# Patient Record
Sex: Female | Born: 1935 | Race: White | Hispanic: No | Smoking: Never smoker
Health system: Southern US, Community
[De-identification: ages and names within clinical notes are randomized; demographics above are authoritative.]

## PROBLEM LIST (undated history)

## (undated) DIAGNOSIS — R42 Dizziness and giddiness: Secondary | ICD-10-CM

## (undated) DIAGNOSIS — I10 Essential (primary) hypertension: Secondary | ICD-10-CM

---

## 2016-04-09 HISTORY — PX: FRACTURE SURGERY: SHX138

## 2018-02-11 ENCOUNTER — Observation Stay
Admission: EM | Admit: 2018-02-11 | Discharge: 2018-02-13 | Disposition: A | Discharge status: Home / Self Care | Payer: Medicare HMO | Attending: Internal Medicine | Admitting: Internal Medicine

## 2018-02-11 DIAGNOSIS — N179 Acute kidney failure, unspecified: Principal | ICD-10-CM | POA: Diagnosis present

## 2018-02-11 DIAGNOSIS — R197 Diarrhea, unspecified: Secondary | ICD-10-CM | POA: Insufficient documentation

## 2018-02-11 DIAGNOSIS — B9620 Unspecified Escherichia coli [E. coli] as the cause of diseases classified elsewhere: Secondary | ICD-10-CM | POA: Insufficient documentation

## 2018-02-11 DIAGNOSIS — I959 Hypotension, unspecified: Secondary | ICD-10-CM | POA: Diagnosis not present

## 2018-02-11 DIAGNOSIS — Z79899 Other long term (current) drug therapy: Secondary | ICD-10-CM | POA: Insufficient documentation

## 2018-02-11 DIAGNOSIS — N3090 Cystitis, unspecified without hematuria: Secondary | ICD-10-CM

## 2018-02-11 DIAGNOSIS — N390 Urinary tract infection, site not specified: Secondary | ICD-10-CM | POA: Insufficient documentation

## 2018-02-11 DIAGNOSIS — E860 Dehydration: Secondary | ICD-10-CM | POA: Diagnosis not present

## 2018-02-11 DIAGNOSIS — R55 Syncope and collapse: Secondary | ICD-10-CM

## 2018-02-11 DIAGNOSIS — I10 Essential (primary) hypertension: Secondary | ICD-10-CM | POA: Diagnosis not present

## 2018-02-11 DIAGNOSIS — F329 Major depressive disorder, single episode, unspecified: Secondary | ICD-10-CM | POA: Insufficient documentation

## 2018-02-11 DIAGNOSIS — R42 Dizziness and giddiness: Secondary | ICD-10-CM | POA: Insufficient documentation

## 2018-02-11 HISTORY — DX: Essential (primary) hypertension: I10

## 2018-02-11 HISTORY — DX: Dizziness and giddiness: R42

## 2018-02-11 LAB — COMPREHENSIVE METABOLIC PANEL
ALK PHOS: 92 U/L (ref 38–126)
ALT: 11 U/L (ref 0–44)
ANION GAP: 9 (ref 5–15)
AST: 27 U/L (ref 15–41)
Albumin: 3.6 g/dL (ref 3.5–5.0)
BILIRUBIN TOTAL: 1.1 mg/dL (ref 0.3–1.2)
BUN: 28 mg/dL — ABNORMAL HIGH (ref 8–23)
CALCIUM: 9.4 mg/dL (ref 8.9–10.3)
CO2: 23 mmol/L (ref 22–32)
CREATININE: 1.84 mg/dL — AB (ref 0.44–1.00)
Chloride: 110 mmol/L (ref 98–111)
GFR calc non Af Amer: 23 mL/min — ABNORMAL LOW (ref >60)
GFR, EST AFRICAN AMERICAN: 26 mL/min — AB (ref >60)
GLUCOSE: 168 mg/dL — AB (ref 70–99)
Potassium: 4.8 mmol/L (ref 3.5–5.1)
SODIUM: 142 mmol/L (ref 135–145)
TOTAL PROTEIN: 7.3 g/dL (ref 6.5–8.1)

## 2018-02-11 LAB — URINALYSIS, ROUTINE W REFLEX MICROSCOPIC
BILIRUBIN URINE: NEGATIVE
Bacteria, UA: NONE SEEN
Glucose, UA: NEGATIVE mg/dL
HGB URINE DIPSTICK: NEGATIVE
KETONES UR: NEGATIVE mg/dL
Nitrite: NEGATIVE
Protein, ur: 100 mg/dL — AB
Specific Gravity, Urine: 1.015 (ref 1.005–1.030)
Squamous Epithelial / LPF: >50 — ABNORMAL HIGH (ref 0–5)
pH: 7 (ref 5.0–8.0)

## 2018-02-11 LAB — C DIFFICILE QUICK SCREEN W PCR REFLEX
C Diff antigen: NEGATIVE
C Diff interpretation: NOT DETECTED
C Diff toxin: NEGATIVE

## 2018-02-11 LAB — CBC WITH DIFFERENTIAL/PLATELET
Abs Immature Granulocytes: 0.1 10*3/uL — ABNORMAL HIGH (ref 0.00–0.07)
Basophils Absolute: 0 10*3/uL (ref 0.0–0.1)
Basophils Relative: 0 %
EOS ABS: 0.2 10*3/uL (ref 0.0–0.5)
Eosinophils Relative: 2 %
HEMATOCRIT: 41.4 % (ref 36.0–46.0)
HEMOGLOBIN: 13.7 g/dL (ref 12.0–15.0)
Immature Granulocytes: 1 %
LYMPHS ABS: 1.8 10*3/uL (ref 0.7–4.0)
LYMPHS PCT: 17 %
MCH: 32.5 pg (ref 26.0–34.0)
MCHC: 33.1 g/dL (ref 30.0–36.0)
MCV: 98.1 fL (ref 80.0–100.0)
MONO ABS: 0.7 10*3/uL (ref 0.1–1.0)
Monocytes Relative: 7 %
Neutro Abs: 7.6 10*3/uL (ref 1.7–7.7)
Neutrophils Relative %: 73 %
Platelets: 247 10*3/uL (ref 150–400)
RBC: 4.22 MIL/uL (ref 3.87–5.11)
RDW: 12.8 % (ref 11.5–15.5)
WBC: 10.4 10*3/uL (ref 4.0–10.5)
nRBC: 0.2 % (ref 0.0–0.2)

## 2018-02-11 LAB — GASTROINTESTINAL PANEL BY PCR, STOOL (REPLACES STOOL CULTURE)

## 2018-02-11 LAB — LIPASE, BLOOD: Lipase: 41 U/L (ref 11–51)

## 2018-02-11 LAB — INFLUENZA PANEL BY PCR (TYPE A & B)
INFLAPCR: NEGATIVE
INFLBPCR: NEGATIVE

## 2018-02-11 LAB — TROPONIN I: Troponin I: <0.03 ng/mL (ref <0.03)

## 2018-02-11 MED ORDER — ALBUTEROL SULFATE (2.5 MG/3ML) 0.083% IN NEBU: 2.5000 mg | INHALATION_SOLUTION | RESPIRATORY_TRACT | Status: DC | PRN

## 2018-02-11 MED ORDER — SODIUM CHLORIDE 0.9 % IV SOLN
1.0000 g | Freq: Once | INTRAVENOUS | Status: AC
  Administered 2018-02-11: 1 g via INTRAVENOUS
  Filled 2018-02-11: qty 10

## 2018-02-11 MED ORDER — ACETAMINOPHEN 650 MG RE SUPP: 650.0000 mg | Freq: Four times a day (QID) | RECTAL | Status: DC | PRN

## 2018-02-11 MED ORDER — SENNOSIDES-DOCUSATE SODIUM 8.6-50 MG PO TABS: 1.0000 | ORAL_TABLET | Freq: Every evening | ORAL | Status: DC | PRN

## 2018-02-11 MED ORDER — SODIUM CHLORIDE 0.9 % IV SOLN
INTRAVENOUS | Status: DC
  Administered 2018-02-11: 22:00:00 via INTRAVENOUS

## 2018-02-11 MED ORDER — HEPARIN SODIUM (PORCINE) 5000 UNIT/ML IJ SOLN
5000.0000 [IU] | Freq: Three times a day (TID) | INTRAMUSCULAR | Status: DC
  Administered 2018-02-11 – 2018-02-13 (×5): 5000 [IU] via SUBCUTANEOUS
  Filled 2018-02-11 (×5): qty 1

## 2018-02-11 MED ORDER — ONDANSETRON HCL 4 MG PO TABS: 4.0000 mg | ORAL_TABLET | Freq: Four times a day (QID) | ORAL | Status: DC | PRN

## 2018-02-11 MED ORDER — BISACODYL 5 MG PO TBEC: 5.0000 mg | DELAYED_RELEASE_TABLET | Freq: Every day | ORAL | Status: DC | PRN

## 2018-02-11 MED ORDER — HYDROCODONE-ACETAMINOPHEN 5-325 MG PO TABS: 1.0000 | ORAL_TABLET | ORAL | Status: DC | PRN

## 2018-02-11 MED ORDER — ACETAMINOPHEN 325 MG PO TABS: 650.0000 mg | ORAL_TABLET | Freq: Four times a day (QID) | ORAL | Status: DC | PRN

## 2018-02-11 MED ORDER — SODIUM CHLORIDE 0.9 % IV BOLUS
1000.0000 mL | Freq: Once | INTRAVENOUS | Status: AC
  Administered 2018-02-11: 1000 mL via INTRAVENOUS

## 2018-02-11 MED ORDER — HYDRALAZINE HCL 20 MG/ML IJ SOLN: 10.0000 mg | Freq: Four times a day (QID) | INTRAMUSCULAR | Status: DC | PRN

## 2018-02-11 MED ORDER — ONDANSETRON HCL 4 MG/2ML IJ SOLN: 4.0000 mg | Freq: Four times a day (QID) | INTRAMUSCULAR | Status: DC | PRN

## 2018-02-11 MED ORDER — ESCITALOPRAM OXALATE 10 MG PO TABS
5.0000 mg | ORAL_TABLET | Freq: Every day | ORAL | Status: DC
  Administered 2018-02-11 – 2018-02-13 (×3): 5 mg via ORAL
  Filled 2018-02-11 (×4): qty 0.5

## 2018-02-11 MED ORDER — SODIUM CHLORIDE 0.9 % IV SOLN
1.0000 g | INTRAVENOUS | Status: DC
  Administered 2018-02-12: 1 g via INTRAVENOUS
  Filled 2018-02-11 (×2): qty 10
  Filled 2018-02-11: qty 1

## 2018-02-11 NOTE — Care Management Note (Signed)
Case Management Note  Patient Details  Name: Connie Beck MRN: [id number] Date of Birth: Aug 23, 1923  Subjective/Objective:           Patient is being placed under observation for UTI and AKI.  She was brought to the ED from home for syncope episodes.  The patient lives with her daughter in law.  Her son was married to the daughter in law and he passed away, the daughter in law has remarried but she has assumed care over the patient.  Daughter in law provides all care in the home, bathing, dressing, cooking.  The patient has a walker, wheelchair, bedside commode, and hospital bed.  When the daughter in law goes on vacation or needs some time she has a friend that is an aide that she pays to come in and take care of the patient.  She would agree to home health services if recommended.  Patient is very hard of hearing.            Action/Plan:   Expected Discharge Date:                  Expected Discharge Plan:     In-House Referral:     Discharge planning Services  CM Consult  Post Acute Care Choice:    Choice offered to:     DME Arranged:    DME Agency:     HH Arranged:    HH Agency:     Status of Service:  In process, will continue to follow  If discussed at Long Length of Stay Meetings, dates discussed:    Additional Comments:  Allayne Butcher, RN 02/11/2018, 4:28 PM

## 2018-02-11 NOTE — ED Notes (Signed)
Pt BP" reading 202/84. Jerrie,RN made aware. BP cuff readjusted and BP rechecked. 171/70

## 2018-02-11 NOTE — ED Triage Notes (Signed)
Patient from home via [facility] c/o syncope. Per EMS pt had a syncopal episode witnessed by daughter (assisted to ground without hitting head) initial BP 84/66. EMS also reports pt incontinent of stool upon arrival with dark stools noted and foul smelling urine reported.

## 2018-02-11 NOTE — Consult Note (Signed)
Pharmacy Antibiotic Note  Connie Beck is a 84 y.o. female admitted on 02/11/2018 with UTI.  Pharmacy has been consulted for Ceftriaxone dosing.  Plan: Ceftriaxone 1g q 24 hours  Height: 5\' 3"  (160 cm) Weight: 144 lb (65.3 kg) IBW/kg (Calculated) : 52.4  Temp (24hrs), Avg:97.7 F (36.5 C), Min:97.7 F (36.5 C), Max:97.7 F (36.5 C)  Recent Labs  Lab 02/11/18 1257  WBC 10.4  CREATININE 1.84*    Estimated Creatinine Clearance: 17.7 mL/min (A) (by C-G formula based on SCr of 1.84 mg/dL (H)).    Allergies  Allergen Reactions  . Darvon [Propoxyphene]   . Penicillins     Has patient had a PCN reaction causing immediate rash, facial/tongue/throat swelling, SOB or lightheadedness with hypotension: Unknown Has patient had a PCN reaction causing severe rash involving mucus membranes or skin necrosis: Unknown Has patient had a PCN reaction that required hospitalization: Unknown Has patient had a PCN reaction occurring within the last 10 years: No If all of the above answers are "NO", then may proceed with Cephalosporin use.  . Sulfa Antibiotics     Antimicrobials this admission: Ceftriaxone 11/5 >>    Dose adjustments this admission: N/A  Microbiology results: 11/05 UCx: pending 11/05 C diff quick scan neg 11/05 GI panel - not detected  Thank you for allowing pharmacy to be a part of this patient's care.  Albina Billet, PharmD Clinical Pharmacist 02/11/2018 3:47 PM

## 2018-02-11 NOTE — ED Notes (Signed)
Pt was cleaned up and clean brief applied.

## 2018-02-11 NOTE — Progress Notes (Signed)
Advanced Care Plan.  Purpose of Encounter: CODE STATUS. Parties in Attendance: The patient, her daughter-in-law and grandson, me. Patient's Decisional Capacity: Yes. Medical Story: Connie Beck  is a 84 y.o. female with a known history of hypertension.  She is being admitted for UTI and acute renal failure.  I discussed with the patient and her daughter-in-law about her current condition, prognosis and the CODE STATUS.  The patient want to be resuscitated and intubated if she has cardiopulmonary arrest.  This is confirmed by her daughter-in-law. Plan:  Code Status: Full code. Time spent discussing advance care planning: 17 minutes.

## 2018-02-11 NOTE — Care Management Obs Status (Signed)
MEDICARE OBSERVATION STATUS NOTIFICATION   Patient Details  Name: Connie Beck MRN: [id number] Date of Birth: Oct 18, 1923   Medicare Observation Status Notification Given:  Yes    Allayne Butcher, RN 02/11/2018, 4:28 PM

## 2018-02-11 NOTE — ED Provider Notes (Addendum)
[facility] Emergency Department Provider Note  ____________________________________________  Time seen: Approximately 2:22 PM  I have reviewed the triage vital signs and the nursing notes.   HISTORY  Chief Complaint Loss of Consciousness  Level 5 Caveat: Portions of the History and Physical including HPI and review of systems are unable to be completely obtained due to patient being a poor historian    HPI Connie Beck is a 84 y.o. female with a history of hypertension who takes no medications who was brought to the ED today after syncope at home.  She was caught by her daughter and lowered to the ground without any significant injury.  Initial blood pressure was 84/66 by first responders.  Family report the patient has had foul-smelling urine recently and dark stools.      Past Medical History:  Diagnosis Date  . Dizzy spells   . Hypertension      There are no active problems to display for this patient.    Past Surgical History:  Procedure Laterality Date  . FRACTURE SURGERY  2018   pelvis     Current medications: None  Allergies Darvon [propoxyphene]; Penicillins; and Sulfa antibiotics   Family History  Family history unknown: Yes    Social History Social History   Tobacco Use  . Smoking status: Never Smoker  . Smokeless tobacco: Never Used  Substance Use Topics  . Alcohol use: Never    Frequency: Never  . Drug use: Never    Review of Systems  Constitutional:   No fever or chills.  ENT:   No sore throat. No rhinorrhea. Cardiovascular:   No chest pain or syncope. Respiratory:   No dyspnea or cough. Gastrointestinal:   Negative for abdominal pain, vomiting and diarrhea.  Musculoskeletal:   Negative for focal pain or swelling All other systems reviewed and are negative except as documented above in ROS and HPI.  ____________________________________________   PHYSICAL EXAM:  VITAL SIGNS: ED Triage Vitals  Enc Vitals  Group     BP 02/11/18 1248 137/80     Pulse Rate 02/11/18 1248 88     Resp 02/11/18 1248 20     Temp 02/11/18 1248 97.7 F (36.5 C)     Temp Source 02/11/18 1248 Oral     SpO2 02/11/18 1246 97 %     Weight 02/11/18 1250 144 lb (65.3 kg)     Height 02/11/18 1250 5\' 3"  (1.6 m)     Head Circumference --      Peak Flow --      Pain Score 02/11/18 1250 0     Pain Loc --      Pain Edu? --      Excl. in GC? --     Vital signs reviewed, nursing assessments reviewed.   Constitutional:   Alert and oriented to person and place. Non-toxic appearance. Eyes:   Conjunctivae are normal. EOMI. PERRL. ENT      Head:   Normocephalic and atraumatic.      Nose:   No congestion/rhinnorhea.       Mouth/Throat:   Dry mucous membranes, no pharyngeal erythema. No peritonsillar mass.       Neck:   No meningismus. Full ROM. Hematological/Lymphatic/Immunilogical:   No cervical lymphadenopathy. Cardiovascular:   RRR. Symmetric bilateral radial and DP pulses.  No murmurs. Cap refill less than 2 seconds. Respiratory:   Normal respiratory effort without tachypnea/retractions. Breath sounds are clear and equal bilaterally. No wheezes/rales/rhonchi. Gastrointestinal:   Soft  and nontender. Non distended. There is no CVA tenderness.  No rebound, rigidity, or guarding.  Large liquid stool in her adult diaper.  Hemoccult negative  Musculoskeletal:   Normal range of motion in all extremities. No joint effusions.  No lower extremity tenderness.  No edema. Neurologic:   Normal speech and language.  Motor grossly intact. No acute focal neurologic deficits are appreciated.  Skin:    Skin is warm, dry and intact. No rash noted.  No petechiae, purpura, or bullae.  ____________________________________________    LABS (pertinent positives/negatives) (all labs ordered are listed, but only abnormal results are displayed) Labs Reviewed  COMPREHENSIVE METABOLIC PANEL - Abnormal; Notable for the following components:       Result Value   Glucose, Bld 168 (*)    BUN 28 (*)    Creatinine, Ser 1.84 (*)    GFR calc non Af Amer 23 (*)    GFR calc Af Amer 26 (*)    All other components within normal limits  CBC WITH DIFFERENTIAL/PLATELET - Abnormal; Notable for the following components:   Abs Immature Granulocytes 0.10 (*)    All other components within normal limits  URINALYSIS, ROUTINE W REFLEX MICROSCOPIC - Abnormal; Notable for the following components:   APPearance TURBID (*)    Protein, ur 100 (*)    Leukocytes, UA SMALL (*)    RBC / HPF >50 (*)    WBC, UA >50 (*)    Squamous Epithelial / LPF >50 (*)    All other components within normal limits  C DIFFICILE QUICK SCREEN W PCR REFLEX  GASTROINTESTINAL PANEL BY PCR, STOOL (REPLACES STOOL CULTURE)  URINE CULTURE  LIPASE, BLOOD  TROPONIN I  INFLUENZA PANEL BY PCR (TYPE A & B)   ____________________________________________   EKG  Interpreted by me Sinus rhythm rate of 89, left axis, normal intervals.  Normal QRS ST segments and T waves.  ____________________________________________    RADIOLOGY  No results found.  ____________________________________________   PROCEDURES Procedures  ____________________________________________  DIFFERENTIAL DIAGNOSIS   UTI, dehydration, GI bleed, vasovagal syncope.  Doubt ACS PE dissection AAA pneumothorax pericarditis pneumonia or sepsis.  CLINICAL IMPRESSION / ASSESSMENT AND PLAN / ED COURSE  Pertinent labs & imaging results that were available during my care of the patient were reviewed by me and considered in my medical decision making (see chart for details).    Patient presents with syncope and one isolated episode of borderline low blood pressure at home.  Current vital signs are unremarkable.  Exam is benign.  Given IV fluids for hydration, check labs and urinalysis.  UTI suspected, but patient is not septic on initial assessment.  No abdominal or flank pain complaints.  No history of  kidney stones.  Doubt occult ureterolithiasis   Clinical Course as of Feb 12 1515  Tue Feb 11, 2018  1510 Flu negative.  C. difficile negative.  Ceftriaxone infused.  Engaged in shared decision-making with family regarding disposition.  Recommended hospitalization given the patient's episode of hypotension and fall earlier today and finding of UTI and AKI.  They agree that the patient is not sufficiently independent at home and needs a lot of assistance that they are not equipped to provide during this acute illness.   [PS]    Clinical Course User Index [PS] Sharman Cheek, MD     ____________________________________________   FINAL CLINICAL IMPRESSION(S) / ED DIAGNOSES    Final diagnoses:  AKI (acute kidney injury) (HCC)  Cystitis  Syncope, unspecified syncope  type     ED Discharge Orders    None      Portions of this note were generated with dragon dictation software. Dictation errors may occur despite best attempts at proofreading.    Sharman Cheek, MD 02/11/18 1516    Sharman Cheek, MD 02/11/18 [phone]

## 2018-02-11 NOTE — ED Notes (Signed)
Kassie RN, aware of bed assigned  

## 2018-02-11 NOTE — H&P (Signed)
[facility]   PATIENT NAME: Connie Beck    MR#:  [id number]  DATE OF BIRTH:  1923/11/17  DATE OF ADMISSION:  02/11/2018  PRIMARY CARE PHYSICIAN: [facility]   REQUESTING/REFERRING PHYSICIAN: Dr. Scotty Court  CHIEF COMPLAINT:   Chief Complaint  Patient presents with  . Loss of Consciousness   Dizziness and generalized weakness. HISTORY OF PRESENT ILLNESS:  Connie Beck  is a 84 y.o. female with a known history of hypertension.  The patient was sent to ED due to above chief complaints.  The patient is very hard of hearing and is not a good historian.  According to her daughter-in-law, the patient has chronic incontinence.  She feels dizzy and was caught by her daughter and lowered to the ground without any significant injury.  Her blood pressure was 84/66 by first responders.  She is found due to infection and acute renal failure in the ED. PAST MEDICAL HISTORY:   Past Medical History:  Diagnosis Date  . Dizzy spells   . Hypertension     PAST SURGICAL HISTORY:   Past Surgical History:  Procedure Laterality Date  . FRACTURE SURGERY  2018   pelvis    SOCIAL HISTORY:   Social History   Tobacco Use  . Smoking status: Never Smoker  . Smokeless tobacco: Never Used  Substance Use Topics  . Alcohol use: Never    Frequency: Never    FAMILY HISTORY:   Family History  Family history unknown: Yes  The patient does not know.  DRUG ALLERGIES:   Allergies  Allergen Reactions  . Darvon [Propoxyphene]   . Penicillins     Has patient had a PCN reaction causing immediate rash, facial/tongue/throat swelling, SOB or lightheadedness with hypotension: Unknown Has patient had a PCN reaction causing severe rash involving mucus membranes or skin necrosis: Unknown Has patient had a PCN reaction that required hospitalization: Unknown Has patient had a PCN reaction occurring within the last 10 years: No If all of the  above answers are "NO", then may proceed with Cephalosporin use.  . Sulfa Antibiotics     REVIEW OF SYSTEMS:   Review of Systems  Unable to perform ROS: Medical condition   Very hard of hearing. MEDICATIONS AT HOME:   Prior to Admission medications   Medication Sig Start Date End Date Taking? Authorizing Provider  escitalopram (LEXAPRO) 5 MG tablet Take 5 mg by mouth daily.   Yes [provider]      VITAL SIGNS:  Blood pressure 135/72, pulse 74, temperature 97.7 F (36.5 C), temperature source Oral, resp. rate 20, height 5\' 3"  (1.6 m), weight 65.3 kg, SpO2 95 %.  PHYSICAL EXAMINATION:  Physical Exam  GENERAL:  84 y.o.-year-old patient lying in the bed with no acute distress.  EYES: Pupils equal, round, reactive to light and accommodation. No scleral icterus. Extraocular muscles intact.  HEENT: Head atraumatic, normocephalic. Oropharynx and nasopharynx clear.  Dry oral mucosa. Very hard of hearing. NECK:  Supple, no jugular venous distention. No thyroid enlargement, no tenderness.  LUNGS: Normal breath sounds bilaterally, no wheezing, rales,rhonchi or crepitation. No use of accessory muscles of respiration.  CARDIOVASCULAR: S1, S2 normal. No murmurs, rubs, or gallops.  ABDOMEN: Soft, nontender, nondistended. Bowel sounds present. No organomegaly or mass.  EXTREMITIES: No pedal edema, cyanosis, or clubbing.  NEUROLOGIC: Cranial nerves II through XII are intact. Muscle strength 3-4/5 in all extremities. Sensation intact. Gait not checked.  PSYCHIATRIC: The  patient is alert and oriented x 3.  SKIN: No obvious rash, lesion, or ulcer.   LABORATORY PANEL:   CBC Recent Labs  Lab 02/11/18 1257  WBC 10.4  HGB 13.7  HCT 41.4  PLT 247   ------------------------------------------------------------------------------------------------------------------  Chemistries  Recent Labs  Lab 02/11/18 1257  NA 142  K 4.8  CL 110  CO2 23  GLUCOSE 168*  BUN 28*  CREATININE  1.84*  CALCIUM 9.4  AST 27  ALT 11  ALKPHOS 92  BILITOT 1.1   ------------------------------------------------------------------------------------------------------------------  Cardiac Enzymes Recent Labs  Lab 02/11/18 1257  TROPONINI <0.03   ------------------------------------------------------------------------------------------------------------------  RADIOLOGY:  No results found.    IMPRESSION AND PLAN:   UTI. The patient will be placed for observation. Continue Rocephin, follow-up urine culture.  Acute renal failure due to dehydration.  Start normal saline IV and follow-up BMP.  Hypertension.  Controlled.medication.  All the records are reviewed and case discussed with ED provider. Management plans discussed with the patient, daughter-in-law and grandson and they are in agreement.  CODE STATUS: Full code  TOTAL TIME TAKING CARE OF THIS PATIENT: 36 minutes.    Shaune Pollack M.D on 02/11/2018 at 3:41 PM  Between 7am to 6pm - Pager - [phone]  After 6pm go to [email or web address] - Scientist, research (life sciences)  Hospitalists  Office  [phone]  CC: Primary care physician; [facility]   Note: This dictation was prepared with Nurse, children's dictation along with smaller phrase technology. Any transcriptional errors that result from this process are unin

## 2018-02-12 LAB — CBC
HCT: 36 % (ref 36.0–46.0)
Hemoglobin: 11.5 g/dL — ABNORMAL LOW (ref 12.0–15.0)
MCH: 32 pg (ref 26.0–34.0)
MCHC: 31.9 g/dL (ref 30.0–36.0)
MCV: 100.3 fL — AB (ref 80.0–100.0)
NRBC: 0 % (ref 0.0–0.2)
PLATELETS: 197 10*3/uL (ref 150–400)
RBC: 3.59 MIL/uL — ABNORMAL LOW (ref 3.87–5.11)
RDW: 12.5 % (ref 11.5–15.5)
WBC: 7.9 10*3/uL (ref 4.0–10.5)

## 2018-02-12 LAB — BASIC METABOLIC PANEL
ANION GAP: 7 (ref 5–15)
BUN: 23 mg/dL (ref 8–23)
CALCIUM: 8.2 mg/dL — AB (ref 8.9–10.3)
CHLORIDE: 113 mmol/L — AB (ref 98–111)
CO2: 20 mmol/L — AB (ref 22–32)
Creatinine, Ser: 1.44 mg/dL — ABNORMAL HIGH (ref 0.44–1.00)
GFR calc non Af Amer: 31 mL/min — ABNORMAL LOW (ref >60)
GFR, EST AFRICAN AMERICAN: 35 mL/min — AB (ref >60)
Glucose, Bld: 83 mg/dL (ref 70–99)
Potassium: 3.9 mmol/L (ref 3.5–5.1)
Sodium: 140 mmol/L (ref 135–145)

## 2018-02-12 LAB — MAGNESIUM: Magnesium: 1.9 mg/dL (ref 1.7–2.4)

## 2018-02-12 NOTE — Progress Notes (Signed)
[employer] [facility] - [place] [facility]                                                                                                                                                                                  Patient Demographics   Connie Beck, is a 84 y.o. female, DOB - Nov 24, 1923, ZOX:[id number]  Admit date - 02/11/2018   Admitting Physician Shaune Pollack, MD  Outpatient Primary MD for the patient is [facility]   LOS - 0  Subjective: Patient admitted with decrease in responsiveness hypotension noted to have acute renal failure Patient doing better still weak    Review of Systems:   CONSTITUTIONAL: No documented fever. No fatigue, positive weakness. No weight gain, no weight loss.  EYES: No blurry or double vision.  ENT: No tinnitus. No postnasal drip. No redness of the oropharynx.  RESPIRATORY: No cough, no wheeze, no hemoptysis. No dyspnea.  CARDIOVASCULAR: No chest pain. No orthopnea. No palpitations. No syncope.  GASTROINTESTINAL: No nausea, no vomiting or diarrhea. No abdominal pain. No melena or hematochezia.  GENITOURINARY: No dysuria or hematuria.  ENDOCRINE: No polyuria or nocturia. No heat or cold intolerance.  HEMATOLOGY: No anemia. No bruising. No bleeding.  INTEGUMENTARY: No rashes. No lesions.  MUSCULOSKELETAL: No arthritis. No swelling. No gout.  NEUROLOGIC: No numbness, tingling, or ataxia. No seizure-type activity.  PSYCHIATRIC: No anxiety. No insomnia. No ADD.    Vitals:   Vitals:   02/11/18 1930 02/11/18 2017 02/11/18 2041 02/12/18 0801  BP: (!) 161/66 (!) 158/79  (!) 155/65  Pulse: (!) 56 60  (!) 54  Resp: 16 19    Temp:  98 F (36.7 C)  98.1 F (36.7 C)  TempSrc:  Oral  Oral  SpO2: 96% 96%  94%  Weight:   64.7 kg   Height:        Wt Readings from Last 3 Encounters:  02/11/18 64.7 kg     Intake/Output Summary (Last 24 hours) at 02/12/2018 1412 Last data filed at 02/12/2018 1032 Gross per 24 hour   Intake 732.57 ml  Output 100 ml  Net 632.57 ml    Physical Exam:   GENERAL: Pleasant-appearing in no apparent distress.  HEAD, EYES, EARS, NOSE AND THROAT: Atraumatic, normocephalic. Extraocular muscles are intact. Pupils equal and reactive to light. Sclerae anicteric. No conjunctival injection. No oro-pharyngeal erythema.  NECK: Supple. There is no jugular venous distention. No bruits, no lymphadenopathy, no thyromegaly.  HEART: Regular rate and rhythm,. No murmurs, no rubs, no clicks.  LUNGS: Clear to auscultation bilaterally. No rales or rhonchi. No wheezes.  ABDOMEN: Soft, flat, nontender, nondistended. Has good bowel sounds. No hepatosplenomegaly  appreciated.  EXTREMITIES: No evidence of any cyanosis, clubbing, or peripheral edema.  +2 pedal and radial pulses bilaterally.  NEUROLOGIC: The patient is alert, awake, and oriented x3 with no focal motor or sensory deficits appreciated bilaterally.  SKIN: Moist and warm with no rashes appreciated.  Psych: Not anxious, depressed LN: No inguinal LN enlargement    Antibiotics   Anti-infectives (From admission, onward)   Start     Dose/Rate Route Frequency Ordered Stop   02/12/18 1430  cefTRIAXone (ROCEPHIN) 1 g in sodium chloride 0.9 % 100 mL IVPB     1 g 200 mL/hr over 30 Minutes Intravenous Every 24 hours 02/11/18 1550     02/11/18 1415  cefTRIAXone (ROCEPHIN) 1 g in sodium chloride 0.9 % 100 mL IVPB     1 g 200 mL/hr over 30 Minutes Intravenous  Once 02/11/18 1405 02/11/18 1500      Medications   Scheduled Meds: . escitalopram  5 mg Oral Daily  . heparin  5,000 Units Subcutaneous Q8H   Continuous Infusions: . sodium chloride 50 mL/hr at 02/12/18 0500  . cefTRIAXone (ROCEPHIN)  IV     PRN Meds:.acetaminophen **OR** acetaminophen, albuterol, bisacodyl, hydrALAZINE, HYDROcodone-acetaminophen, ondansetron **OR** ondansetron (ZOFRAN) IV, senna-docusate   Data Review:   Micro Results Recent Results (from the past 240  hour(s))  Gastrointestinal Panel by PCR , Stool     Status: None   Collection Time: 02/11/18 12:57 PM  Result Value Ref Range Status   Campylobacter species NOT DETECTED NOT DETECTED Final   Plesimonas shigelloides NOT DETECTED NOT DETECTED Final   Salmonella species NOT DETECTED NOT DETECTED Final   Yersinia enterocolitica NOT DETECTED NOT DETECTED Final   Vibrio species NOT DETECTED NOT DETECTED Final   Vibrio cholerae NOT DETECTED NOT DETECTED Final   Enteroaggregative E coli (EAEC) NOT DETECTED NOT DETECTED Final   Enteropathogenic E coli (EPEC) NOT DETECTED NOT DETECTED Final   Enterotoxigenic E coli (ETEC) NOT DETECTED NOT DETECTED Final   Shiga like toxin producing E coli (STEC) NOT DETECTED NOT DETECTED Final   Shigella/Enteroinvasive E coli (EIEC) NOT DETECTED NOT DETECTED Final   Cryptosporidium NOT DETECTED NOT DETECTED Final   Cyclospora cayetanensis NOT DETECTED NOT DETECTED Final   Entamoeba histolytica NOT DETECTED NOT DETECTED Final   Giardia lamblia NOT DETECTED NOT DETECTED Final   Adenovirus F40/41 NOT DETECTED NOT DETECTED Final   Astrovirus NOT DETECTED NOT DETECTED Final   Norovirus GI/GII NOT DETECTED NOT DETECTED Final   Rotavirus A NOT DETECTED NOT DETECTED Final   Sapovirus (I, II, IV, and V) NOT DETECTED NOT DETECTED Final    Comment: Performed at [facility], [address] [place], [place] [address]  C difficile quick scan w PCR reflex     Status: None   Collection Time: 02/11/18  1:11 PM  Result Value Ref Range Status   C Diff antigen NEGATIVE NEGATIVE Final   C Diff toxin NEGATIVE NEGATIVE Final   C Diff interpretation No C. difficile detected.  Final    Comment: Performed at [facility], [address] [place], [place] [address]    Radiology Reports No results found.   CBC Recent Labs  Lab 02/11/18 1257 02/12/18 0522  WBC 10.4 7.9  HGB 13.7 11.5*  HCT 41.4 36.0  PLT 247 197  MCV 98.1 100.3*  MCH 32.5 32.0  MCHC  33.1 31.9  RDW 12.8 12.5  LYMPHSABS 1.8  --   MONOABS 0.7  --  EOSABS 0.2  --   BASOSABS 0.0  --     Chemistries  Recent Labs  Lab 02/11/18 1257 02/12/18 0522  NA 142 140  K 4.8 3.9  CL 110 113*  CO2 23 20*  GLUCOSE 168* 83  BUN 28* 23  CREATININE 1.84* 1.44*  CALCIUM 9.4 8.2*  MG  --  1.9  AST 27  --   ALT 11  --   ALKPHOS 92  --   BILITOT 1.1  --    ------------------------------------------------------------------------------------------------------------------ estimated creatinine clearance is 22.5 mL/min (A) (by C-G formula based on SCr of 1.44 mg/dL (H)). ------------------------------------------------------------------------------------------------------------------ No results for input(s): HGBA1C in the last 72 hours. ------------------------------------------------------------------------------------------------------------------ No results for input(s): CHOL, HDL, LDLCALC, TRIG, CHOLHDL, LDLDIRECT in the last 72 hours. ------------------------------------------------------------------------------------------------------------------ No results for input(s): TSH, T4TOTAL, T3FREE, THYROIDAB in the last 72 hours.  Invalid input(s): FREET3 ------------------------------------------------------------------------------------------------------------------ No results for input(s): VITAMINB12, FOLATE, FERRITIN, TIBC, IRON, RETICCTPCT in the last 72 hours.  Coagulation profile No results for input(s): INR, PROTIME in the last 168 hours.  No results for input(s): DDIMER in the last 72 hours.  Cardiac Enzymes Recent Labs  Lab 02/11/18 1257  TROPONINI <0.03   ------------------------------------------------------------------------------------------------------------------ Invalid input(s): POCBNP    Assessment & Plan  Patient's 84 year old admitted with hypotension weakness   #1 acute renal failure due to dehydration no previous renal function available to  compare continue IV fluids monitor renal function  #2 UTI. Continue Rocephin follow urine culture  #3 essential hypertension Currently not on any medication blood pressure stable Use IV hydralazine as needed  #4 depression continue Lexapro  #5 lovenox for DVT prophylaxis       Code Status Orders  (From admission, onward)         Start     Ordered   02/11/18 2055  Full code  Continuous     02/11/18 2054        Code Status History    This patient has a current code status but no historical code status.           Consults  none   DVT Prophylaxis  Lovenox   Lab Results  Component Value Date   PLT 197 02/12/2018     Time Spent in minutes    Greater than 50% of time spent in care coordination and counseling patient regarding the condition and plan of care.   Auburn Bilberry M.D on 02/12/2018 at 2:12 PM  Between 7am to 6pm - Pager - [phone]  After 6pm go to [email or web address] - Social research officer, government  [employer]   Office  [phone]

## 2018-02-13 LAB — BASIC METABOLIC PANEL
Anion gap: 6 (ref 5–15)
BUN: 21 mg/dL (ref 8–23)
CHLORIDE: 112 mmol/L — AB (ref 98–111)
CO2: 23 mmol/L (ref 22–32)
Calcium: 8 mg/dL — ABNORMAL LOW (ref 8.9–10.3)
Creatinine, Ser: 1.51 mg/dL — ABNORMAL HIGH (ref 0.44–1.00)
GFR calc Af Amer: 33 mL/min — ABNORMAL LOW (ref >60)
GFR, EST NON AFRICAN AMERICAN: 29 mL/min — AB (ref >60)
GLUCOSE: 86 mg/dL (ref 70–99)
POTASSIUM: 3.9 mmol/L (ref 3.5–5.1)
Sodium: 141 mmol/L (ref 135–145)

## 2018-02-13 LAB — CBC
HEMATOCRIT: 33.6 % — AB (ref 36.0–46.0)
HEMOGLOBIN: 11 g/dL — AB (ref 12.0–15.0)
MCH: 32.7 pg (ref 26.0–34.0)
MCHC: 32.7 g/dL (ref 30.0–36.0)
MCV: 100 fL (ref 80.0–100.0)
Platelets: 210 10*3/uL (ref 150–400)
RBC: 3.36 MIL/uL — AB (ref 3.87–5.11)
RDW: 12.5 % (ref 11.5–15.5)
WBC: 8.1 10*3/uL (ref 4.0–10.5)
nRBC: 0 % (ref 0.0–0.2)

## 2018-02-13 MED ORDER — CIPROFLOXACIN HCL 500 MG PO TABS
500.0000 mg | ORAL_TABLET | ORAL | Status: DC
  Administered 2018-02-13: 500 mg via ORAL
  Filled 2018-02-13: qty 1

## 2018-02-13 MED ORDER — CIPROFLOXACIN HCL 500 MG PO TABS: 500.0000 mg | ORAL_TABLET | ORAL | 0 refills | Status: DC

## 2018-02-13 NOTE — Progress Notes (Signed)
Pt. Discharged to home with daughter via daughters vehicle. Discharge instructions and medication regimen reviewed at bedside with patients daughter who verbalizes understanding of instructions and medication regimen. Prescriptions in packet with daughter. Patient assessment unchanged from this morning. IV discontinued per policy.

## 2018-02-13 NOTE — Care Management Note (Signed)
Case Management Note  Patient Details  Name: Connie Beck MRN: [id number] Date of Birth: 1924/02/13  Subjective/Objective:  Spoke with HCPOA regarding discharge planning. She lives with patient and is with her 24/7. She refused a Charity fundraiser, denying the need. NO preference for PT. [facility]. NO DME needs. Patient uses a walker and wheelchair at home. [facility]. Discharging today                  Action/Plan:   Expected Discharge Date:  02/13/18               Expected Discharge Plan:  Home w Home Health Services  In-House Referral:     Discharge planning Services  CM Consult  Post Acute Care Choice:  Home Health Choice offered to:  [facility] POA / Guardian  DME Arranged:    DME Agency:     HH Arranged:  PT HH Agency:  [facility]  Status of Service:  Completed, signed off  If discussed at Long Length of Stay Meetings, dates discussed:    Additional Comments:  Marily Memos, RN 02/13/2018, 12:59 PM

## 2018-02-13 NOTE — Consult Note (Signed)
Pharmacy Antibiotic Note  Connie Beck is a 84 y.o. female admitted on 02/11/2018 with UTI.  Pharmacy has been consulted for ciprofloxacin dosing. She was admitted with ARF also and her SCr remains elevated. Her baseline SCr is uncertain because we do not have older lab values. She was started on IV Rocephin but her urine culture has since grown out E coli that is pan-sensitive.  Plan: Ciprofloxacin 500mg  po every 18 hours  Height: 5\' 3"  (160 cm) Weight: 142 lb 11.2 oz (64.7 kg) IBW/kg (Calculated) : 52.4  Temp (24hrs), Avg:98.4 F (36.9 C), Min:97.5 F (36.4 C), Max:99.3 F (37.4 C)  Recent Labs  Lab 02/11/18 1257 02/12/18 0522 02/13/18 0433  WBC 10.4 7.9 8.1  CREATININE 1.84* 1.44* 1.51*    Estimated Creatinine Clearance: 21.5 mL/min (A) (by C-G formula based on SCr of 1.51 mg/dL (H)).    Allergies  Allergen Reactions  . Darvon [Propoxyphene]   . Penicillins     Has patient had a PCN reaction causing immediate rash, facial/tongue/throat swelling, SOB or lightheadedness with hypotension: Unknown Has patient had a PCN reaction causing severe rash involving mucus membranes or skin necrosis: Unknown Has patient had a PCN reaction that required hospitalization: Unknown Has patient had a PCN reaction occurring within the last 10 years: No If all of the above answers are "NO", then may proceed with Cephalosporin use.  . Sulfa Antibiotics     Antimicrobials this admission: ceftriaxone 11/5 >> 11/7 ciprofloxacin 11/7 >>   Microbiology results: 11/5 UCx: pan-S E coli   Thank you for allowing pharmacy to be a part of this patient's care.  Lowella Bandy 02/13/2018 10:40 AM

## 2018-02-13 NOTE — Evaluation (Signed)
Physical Therapy Evaluation Patient Details Name: Connie Beck MRN: [id number] DOB: [birth year] Today's Date: 02/13/2018   History of Present Illness  Pt is a 84 y/o F who presented after dizzy episode with LOC and daughter slowly lowering pt to the floor.  BP was 84/66 by first responders.  Pt was found to have UTI and noted to have acute renal failure. Pt's PMH includes pelvic fx surgery.     Clinical Impression  Pt admitted with above diagnosis. Pt currently with functional limitations due to the deficits listed below (see PT Problem List). Connie Beck appears to be close her baseline level of mobility and family feels comfortable with pt returning home at d/c.  She does, however, demonstrate significant BLE and BUE weakness and fatigues quickly with ambulatory activity, thus recommending HHPT at d/c. BP taken in supine at start of session: 152/64, in sitting EOB 115/56 pt denies dizziness.  Attempted to take BP reading in standing x2 without success, pt initially reporting dizziness which resolves within ~1 minute. Family educated in waiting at least 1-2 minutes with each transition when moving from supine>stand and then to walking.  Family verbalized understanding and reports already practicing this at home.  Pt will benefit from skilled PT to increase their independence and safety with mobility to allow discharge to the venue listed below.      Follow Up Recommendations Home health PT;Supervision/Assistance - 24 hour    Equipment Recommendations  None recommended by PT    Recommendations for Other Services       Precautions / Restrictions Precautions Precautions: Fall;Other (comment) Precaution Comments: +orthostatics Restrictions Weight Bearing Restrictions: No      Mobility  Bed Mobility Overal bed mobility: Needs Assistance Bed Mobility: Supine to Sit     Supine to sit: Mod assist;HOB elevated     General bed mobility comments: Assist to elevate trunk as pt pulls on bed  rail.  Assist with use of bed pad to scoot EOB.   Transfers Overall transfer level: Needs assistance Equipment used: (youth RW) Transfers: Sit to/from Stand Sit to Stand: Min assist;+2 physical assistance         General transfer comment: Pt stood from bed x2 with min +2 assist each time to assist with boosting to standing and due to posterior bias.  Pt instructed in weight shifting toward front of her feet as she has tendency to place weight through heels only.   Ambulation/Gait Ambulation/Gait assistance: Min guard;+2 safety/equipment Gait Distance (Feet): 30 Feet Assistive device: (youth RW) Gait Pattern/deviations: Decreased step length - right;Decreased step length - left;Trunk flexed   Gait velocity interpretation: <1.31 ft/sec, indicative of household ambulator General Gait Details: Flexed posture and pt takes short steps Bil.  Pt fatigues and reports low back pain after ambulating 30 ft.    Stairs            Wheelchair Mobility    Modified Rankin (Stroke Patients Only)       Balance Overall balance assessment: Needs assistance Sitting-balance support: Single extremity supported;Feet supported Sitting balance-Leahy Scale: Poor Sitting balance - Comments: Pt relies on at least 1UE support while sitting EOB Postural control: Posterior lean Standing balance support: Bilateral upper extremity supported;During functional activity Standing balance-Leahy Scale: Poor Standing balance comment: Pt relies on BUE support for static and dynamic activities                             Pertinent Vitals/Pain Pain  Assessment: Faces Faces Pain Scale: Hurts little more Pain Location: L hip region which pt reports is likely related to chronic back pain Pain Descriptors / Indicators: Discomfort Pain Intervention(s): Limited activity within patient's tolerance;Monitored during session;Repositioned;Utilized relaxation techniques    Home Living Family/patient expects  to be discharged to:: Private residence Living Arrangements: Children(daughter in law (who is a CNA)) Available Help at Discharge: Family;Available 24 hours/day Type of Home: House Home Access: Ramped entrance     Home Layout: One level Home Equipment: Walker - 2 wheels;Walker - 4 wheels;Bedside commode;Wheelchair - manual;Hospital bed      Prior Function Level of Independence: Needs assistance   Gait / Transfers Assistance Needed: Ambualtes short household distances with rollator, family pushes WC in community.  No additional falls in the past 6 months.    ADL's / Homemaking Assistance Needed: Assist with bathing, dressing.  Family does the driving, cooking, cleaning.          Hand Dominance        Extremity/Trunk Assessment   Upper Extremity Assessment Upper Extremity Assessment: Generalized weakness    Lower Extremity Assessment Lower Extremity Assessment: Generalized weakness    Cervical / Trunk Assessment Cervical / Trunk Assessment: Kyphotic;Other exceptions Cervical / Trunk Exceptions: chornic back pain (pt reports "ruptured disc")  Communication   Communication: HOH(hears better out of L ear)  Cognition Arousal/Alertness: Awake/alert Behavior During Therapy: WFL for tasks assessed/performed Overall Cognitive Status: Difficult to assess                                 General Comments: Family reports cognition is WFL at baseline and pt does not seem confused today.       General Comments General comments (skin integrity, edema, etc.): BP taken in supine at start of session: 152/64, in sitting EOB 115/56 pt denies dizziness.  Attempted to take BP reading in standing x2 without success, pt initially reporting dizziness which resolves within ~1 minute. Family educated in waiting at least 1-2 minutes with each transition when moving from supine>stand and then to walking.  Family verbalized understanding and reports already practicing this at home.      Exercises     Assessment/Plan    PT Assessment Patient needs continued PT services  PT Problem List Decreased strength;Decreased activity tolerance;Decreased balance;Decreased mobility;Decreased knowledge of use of DME;Decreased safety awareness;Pain       PT Treatment Interventions DME instruction;Gait training;Functional mobility training;Therapeutic activities;Therapeutic exercise;Balance training;Neuromuscular re-education;Patient/family education;Wheelchair mobility training    PT Goals (Current goals can be found in the Care Plan section)  Acute Rehab PT Goals Patient Stated Goal: to return home at d/c PT Goal Formulation: With patient/family Time For Goal Achievement: 02/27/18 Potential to Achieve Goals: Good    Frequency Min 2X/week   Barriers to discharge        Co-evaluation               AM-PAC PT "6 Clicks" Daily Activity  Outcome Measure Difficulty turning over in bed (including adjusting bedclothes, sheets and blankets)?: Unable Difficulty moving from lying on back to sitting on the side of the bed? : Unable Difficulty sitting down on and standing up from a chair with arms (e.g., wheelchair, bedside commode, etc,.)?: Unable Help needed moving to and from a bed to chair (including a wheelchair)?: A Little Help needed walking in hospital room?: A Little Help needed climbing 3-5 steps with a railing? : Total  6 Click Score: 10    End of Session Equipment Utilized During Treatment: Gait belt Activity Tolerance: Patient limited by fatigue;Patient limited by pain Patient left: in chair;with call bell/phone within reach;with chair alarm set;with family/visitor present Nurse Communication: Mobility status;Other (comment)(BP readings) PT Visit Diagnosis: Unsteadiness on feet (R26.81);Other abnormalities of gait and mobility (R26.89);Muscle weakness (generalized) (M62.81);Pain Pain - Right/Left: (lower) Pain - part of body: (back)    Time: 1610-9604 PT Time  Calculation (min) (ACUTE ONLY): 41 min   Charges:   PT Evaluation $PT Eval Moderate Complexity: 1 Mod PT Treatments $Gait Training: 8-22 mins        Encarnacion Chu PT, DPT 02/13/2018, 11:33 AM

## 2018-02-13 NOTE — Discharge Summary (Signed)
[facility] [place] at [facility]   PATIENT NAME: Connie Beck    MR#:  [id number]  DATE OF BIRTH:  1923-11-17  DATE OF ADMISSION:  02/11/2018   ADMITTING PHYSICIAN: Shaune Pollack, MD  DATE OF DISCHARGE: 02/13/2018  1:30 PM  PRIMARY CARE PHYSICIAN: [facility]   ADMISSION DIAGNOSIS:  AKI (acute kidney injury) (HCC) [N17.9] Cystitis [N30.90] Syncope, unspecified syncope type [R55] DISCHARGE DIAGNOSIS:  Active Problems:   ARF (acute renal failure) (HCC)  SECONDARY DIAGNOSIS:   Past Medical History:  Diagnosis Date  . Dizzy spells   . Hypertension    HOSPITAL COURSE:  Patient's 84 year old admitted with hypotension weakness  #1 acute renal failure due to dehydration no previous renal function available to compare.  She was treated with IV fluid support.  Creatinine decreased from 1.84 to1.51.  #2 UTI. She was treated with IV Rocephin.  Urine cultures show E. Coli, sensitive to Rocephin and Cipro.  Change to p.o. Cipro every 18 hours for 3 more doses.  #3 essential hypertension Currently not on any medication blood pressure stable  #4 depression continue Lexapro Generalized weakness.  Home health and PT. DISCHARGE CONDITIONS:  Stable, discharged to home with home health and PT today. CONSULTS OBTAINED:   DRUG ALLERGIES:   Allergies  Allergen Reactions  . Darvon [Propoxyphene]   . Penicillins     Has patient had a PCN reaction causing immediate rash, facial/tongue/throat swelling, SOB or lightheadedness with hypotension: Unknown Has patient had a PCN reaction causing severe rash involving mucus membranes or skin necrosis: Unknown Has patient had a PCN reaction that required hospitalization: Unknown Has patient had a PCN reaction occurring within the last 10 years: No If all of the above answers are "NO", then may proceed with Cephalosporin use.  . Sulfa Antibiotics    DISCHARGE MEDICATIONS:   Allergies as of  02/13/2018      Reactions   Darvon [propoxyphene]    Penicillins    Has patient had a PCN reaction causing immediate rash, facial/tongue/throat swelling, SOB or lightheadedness with hypotension: Unknown Has patient had a PCN reaction causing severe rash involving mucus membranes or skin necrosis: Unknown Has patient had a PCN reaction that required hospitalization: Unknown Has patient had a PCN reaction occurring within the last 10 years: No If all of the above answers are "NO", then may proceed with Cephalosporin use.   Sulfa Antibiotics       Medication List    TAKE these medications   ciprofloxacin 500 MG tablet Commonly known as:  CIPRO Take 1 tablet (500 mg total) by mouth every 18 (eighteen) hours.   escitalopram 5 MG tablet Commonly known as:  LEXAPRO Take 5 mg by mouth daily.        DISCHARGE INSTRUCTIONS:  See AVS.  If you experience worsening of your admission symptoms, develop shortness of breath, life threatening emergency, suicidal or homicidal thoughts you must seek medical attention immediately by calling 911 or calling your MD immediately  if symptoms less severe.  You Must read complete instructions/literature along with all the possible adverse reactions/side effects for all the Medicines you take and that have been prescribed to you. Take any new Medicines after you have completely understood and accpet all the possible adverse reactions/side effects.   Please note  You were cared for by a hospitalist during your hospital stay. If you have any questions about your discharge medications or the care you received while you were in  the hospital after you are discharged, you can call the unit and asked to speak with the hospitalist on call if the hospitalist that took care of you is not available. Once you are discharged, your primary care physician will handle any further medical issues. Please note that NO REFILLS for any discharge medications will be authorized  once you are discharged, as it is imperative that you return to your primary care physician (or establish a relationship with a primary care physician if you do not have one) for your aftercare needs so that they can reassess your need for medications and monitor your lab values.    On the day of Discharge:  VITAL SIGNS:  Blood pressure (!) 156/64, pulse 67, temperature 99.3 F (37.4 C), temperature source Oral, resp. rate 16, height 5\' 3"  (1.6 m), weight 64.7 kg, SpO2 93 %. PHYSICAL EXAMINATION:  GENERAL:  84 y.o.-year-old patient lying in the bed with no acute distress.  EYES: Pupils equal, round, reactive to light and accommodation. No scleral icterus. Extraocular muscles intact.  HEENT: Head atraumatic, normocephalic. Oropharynx and nasopharynx clear.  Hard hearing. NECK:  Supple, no jugular venous distention. No thyroid enlargement, no tenderness.  LUNGS: Normal breath sounds bilaterally, no wheezing, rales,rhonchi or crepitation. No use of accessory muscles of respiration.  CARDIOVASCULAR: S1, S2 normal. No murmurs, rubs, or gallops.  ABDOMEN: Soft, non-tender, non-distended. Bowel sounds present. No organomegaly or mass.  EXTREMITIES: No pedal edema, cyanosis, or clubbing.  NEUROLOGIC: Cranial nerves II through XII are intact. Muscle strength 4/5 in all extremities. Sensation intact. Gait not checked.  PSYCHIATRIC: The patient is alert and oriented x 3.  SKIN: No obvious rash, lesion, or ulcer.  DATA REVIEW:   CBC Recent Labs  Lab 02/13/18 0433  WBC 8.1  HGB 11.0*  HCT 33.6*  PLT 210    Chemistries  Recent Labs  Lab 02/11/18 1257 02/12/18 0522 02/13/18 0433  NA 142 140 141  K 4.8 3.9 3.9  CL 110 113* 112*  CO2 23 20* 23  GLUCOSE 168* 83 86  BUN 28* 23 21  CREATININE 1.84* 1.44* 1.51*  CALCIUM 9.4 8.2* 8.0*  MG  --  1.9  --   AST 27  --   --   ALT 11  --   --   ALKPHOS 92  --   --   BILITOT 1.1  --   --      Microbiology Results  Results for orders placed  or performed during the hospital encounter of 02/11/18  Gastrointestinal Panel by PCR , Stool     Status: None   Collection Time: 02/11/18 12:57 PM  Result Value Ref Range Status   Campylobacter species NOT DETECTED NOT DETECTED Final   Plesimonas shigelloides NOT DETECTED NOT DETECTED Final   Salmonella species NOT DETECTED NOT DETECTED Final   Yersinia enterocolitica NOT DETECTED NOT DETECTED Final   Vibrio species NOT DETECTED NOT DETECTED Final   Vibrio cholerae NOT DETECTED NOT DETECTED Final   Enteroaggregative E coli (EAEC) NOT DETECTED NOT DETECTED Final   Enteropathogenic E coli (EPEC) NOT DETECTED NOT DETECTED Final   Enterotoxigenic E coli (ETEC) NOT DETECTED NOT DETECTED Final   Shiga like toxin producing E coli (STEC) NOT DETECTED NOT DETECTED Final   Shigella/Enteroinvasive E coli (EIEC) NOT DETECTED NOT DETECTED Final   Cryptosporidium NOT DETECTED NOT DETECTED Final   Cyclospora cayetanensis NOT DETECTED NOT DETECTED Final   Entamoeba histolytica NOT DETECTED NOT DETECTED Final   Giardia  lamblia NOT DETECTED NOT DETECTED Final   Adenovirus F40/41 NOT DETECTED NOT DETECTED Final   Astrovirus NOT DETECTED NOT DETECTED Final   Norovirus GI/GII NOT DETECTED NOT DETECTED Final   Rotavirus A NOT DETECTED NOT DETECTED Final   Sapovirus (I, II, IV, and V) NOT DETECTED NOT DETECTED Final    Comment: Performed at [facility], [address] [place], [place] [address]  Urine Culture     Status: Abnormal (Preliminary result)   Collection Time: 02/11/18 12:57 PM  Result Value Ref Range Status   Specimen Description   Final    URINE, RANDOM Performed at [facility], [address] [place], [place] [address]    Special Requests   Final    NONE Performed at [facility], [address] [place], [place] [address]    Culture (A)  Final    >=100,000 COLONIES/mL ESCHERICHIA COLI CULTURE REINCUBATED FOR BETTER GROWTH Performed at [facility], [address], [place], [place] [address]    Report Status PENDING  Incomplete   Organism ID, Bacteria ESCHERICHIA COLI (A)  Final      Susceptibility   Escherichia coli - MIC*    AMPICILLIN 4 SENSITIVE Sensitive     CEFAZOLIN <=4 SENSITIVE Sensitive     CEFTRIAXONE <=1 SENSITIVE Sensitive     CIPROFLOXACIN <=0.25 SENSITIVE Sensitive     GENTAMICIN <=1 SENSITIVE Sensitive     IMIPENEM <=0.25 SENSITIVE Sensitive     NITROFURANTOIN <=16 SENSITIVE Sensitive     TRIMETH/SULFA <=20 SENSITIVE Sensitive     AMPICILLIN/SULBACTAM <=2 SENSITIVE Sensitive     PIP/TAZO <=4 SENSITIVE Sensitive     Extended ESBL NEGATIVE Sensitive     * >=100,000 COLONIES/mL ESCHERICHIA COLI  C difficile quick scan w PCR reflex     Status: None   Collection Time: 02/11/18  1:11 PM  Result Value Ref Range Status   C Diff antigen NEGATIVE NEGATIVE Final   C Diff toxin NEGATIVE NEGATIVE Final   C Diff interpretation No C. difficile detected.  Final    Comment: Performed at [facility], [address] [place], [place] [address]    RADIOLOGY:  No results found.   Management plans discussed with the patient, daughter-in-law and they are in agreement.  CODE STATUS: Full Code   TOTAL TIME TAKING CARE OF THIS PATIENT: 33 minutes.    Shaune Pollack M.D on 02/13/2018 at 2:55 PM  Between 7am to 6pm - Pager - [phone]  After 6pm go to [email or web address] - Scientist, research (life sciences) [facility]  Office  [phone]  CC: Primary care physician; [facility]   Note: This dictation was prepared with Nurse, children's dictation along with smaller phrase technology. Any transcriptional errors that result from this process are unintentional.

## 2018-02-13 NOTE — Discharge Instructions (Signed)
-   Fall precaution 

## 2018-02-15 LAB — URINE CULTURE: Culture: >=100000 — AB

## 2018-02-22 NOTE — Progress Notes (Signed)
 -----------------------------------------   8:13 PM on 02/22/2018 -----------------------------------------  To further clarify reason for ordering GI panel, the patient presented with hypotension and diarrhea on exam.  There was a need to ascertain whether she had infectious colitis that may benefit from antibiotics.

## 2018-03-12 ENCOUNTER — Observation Stay: Payer: Medicare HMO

## 2018-03-12 ENCOUNTER — Emergency Department: Payer: Medicare HMO

## 2018-03-12 ENCOUNTER — Other Ambulatory Visit: Payer: Self-pay

## 2018-03-12 ENCOUNTER — Observation Stay
Admission: EM | Admit: 2018-03-12 | Discharge: 2018-03-17 | Disposition: A | Discharge status: Skilled Nursing Facility | Payer: Medicare HMO | Attending: Internal Medicine | Admitting: Internal Medicine

## 2018-03-12 DIAGNOSIS — R0602 Shortness of breath: Secondary | ICD-10-CM | POA: Insufficient documentation

## 2018-03-12 DIAGNOSIS — Z23 Encounter for immunization: Secondary | ICD-10-CM | POA: Insufficient documentation

## 2018-03-12 DIAGNOSIS — N183 Chronic kidney disease, stage 3 (moderate): Secondary | ICD-10-CM | POA: Diagnosis not present

## 2018-03-12 DIAGNOSIS — K5900 Constipation, unspecified: Secondary | ICD-10-CM | POA: Diagnosis present

## 2018-03-12 DIAGNOSIS — I071 Rheumatic tricuspid insufficiency: Secondary | ICD-10-CM | POA: Insufficient documentation

## 2018-03-12 DIAGNOSIS — I70208 Unspecified atherosclerosis of native arteries of extremities, other extremity: Secondary | ICD-10-CM | POA: Diagnosis not present

## 2018-03-12 DIAGNOSIS — Z79899 Other long term (current) drug therapy: Secondary | ICD-10-CM | POA: Insufficient documentation

## 2018-03-12 DIAGNOSIS — M8580 Other specified disorders of bone density and structure, unspecified site: Secondary | ICD-10-CM | POA: Insufficient documentation

## 2018-03-12 DIAGNOSIS — R531 Weakness: Secondary | ICD-10-CM | POA: Diagnosis not present

## 2018-03-12 DIAGNOSIS — N390 Urinary tract infection, site not specified: Principal | ICD-10-CM | POA: Insufficient documentation

## 2018-03-12 DIAGNOSIS — E860 Dehydration: Secondary | ICD-10-CM | POA: Diagnosis not present

## 2018-03-12 DIAGNOSIS — M6281 Muscle weakness (generalized): Secondary | ICD-10-CM | POA: Diagnosis not present

## 2018-03-12 DIAGNOSIS — J90 Pleural effusion, not elsewhere classified: Secondary | ICD-10-CM | POA: Diagnosis not present

## 2018-03-12 DIAGNOSIS — I639 Cerebral infarction, unspecified: Secondary | ICD-10-CM | POA: Diagnosis not present

## 2018-03-12 DIAGNOSIS — I4891 Unspecified atrial fibrillation: Secondary | ICD-10-CM | POA: Insufficient documentation

## 2018-03-12 DIAGNOSIS — I444 Left anterior fascicular block: Secondary | ICD-10-CM | POA: Insufficient documentation

## 2018-03-12 DIAGNOSIS — I129 Hypertensive chronic kidney disease with stage 1 through stage 4 chronic kidney disease, or unspecified chronic kidney disease: Secondary | ICD-10-CM | POA: Diagnosis not present

## 2018-03-12 DIAGNOSIS — R55 Syncope and collapse: Secondary | ICD-10-CM | POA: Insufficient documentation

## 2018-03-12 LAB — CBC WITH DIFFERENTIAL/PLATELET
Abs Immature Granulocytes: 0.08 10*3/uL — ABNORMAL HIGH (ref 0.00–0.07)
Basophils Absolute: 0 10*3/uL (ref 0.0–0.1)
Basophils Relative: 0 %
Eosinophils Absolute: 0.4 10*3/uL (ref 0.0–0.5)
Eosinophils Relative: 4 %
HCT: 39.6 % (ref 36.0–46.0)
Hemoglobin: 13 g/dL (ref 12.0–15.0)
Immature Granulocytes: 1 %
Lymphocytes Relative: 27 %
Lymphs Abs: 2.4 10*3/uL (ref 0.7–4.0)
MCH: 32.1 pg (ref 26.0–34.0)
MCHC: 32.8 g/dL (ref 30.0–36.0)
MCV: 97.8 fL (ref 80.0–100.0)
Monocytes Absolute: 0.6 10*3/uL (ref 0.1–1.0)
Monocytes Relative: 7 %
NEUTROS ABS: 5.4 10*3/uL (ref 1.7–7.7)
Neutrophils Relative %: 61 %
Platelets: 258 10*3/uL (ref 150–400)
RBC: 4.05 MIL/uL (ref 3.87–5.11)
RDW: 12.6 % (ref 11.5–15.5)
WBC: 8.9 10*3/uL (ref 4.0–10.5)
nRBC: 0 % (ref 0.0–0.2)

## 2018-03-12 LAB — COMPREHENSIVE METABOLIC PANEL
ALT: 11 U/L (ref 0–44)
AST: 22 U/L (ref 15–41)
Albumin: 3.2 g/dL — ABNORMAL LOW (ref 3.5–5.0)
Alkaline Phosphatase: 77 U/L (ref 38–126)
Anion gap: 9 (ref 5–15)
BUN: 27 mg/dL — ABNORMAL HIGH (ref 8–23)
CHLORIDE: 105 mmol/L (ref 98–111)
CO2: 25 mmol/L (ref 22–32)
CREATININE: 1.48 mg/dL — AB (ref 0.44–1.00)
Calcium: 9 mg/dL (ref 8.9–10.3)
GFR calc Af Amer: 35 mL/min — ABNORMAL LOW (ref >60)
GFR calc non Af Amer: 30 mL/min — ABNORMAL LOW (ref >60)
Glucose, Bld: 106 mg/dL — ABNORMAL HIGH (ref 70–99)
Potassium: 3.9 mmol/L (ref 3.5–5.1)
Sodium: 139 mmol/L (ref 135–145)
Total Bilirubin: 0.5 mg/dL (ref 0.3–1.2)
Total Protein: 7.1 g/dL (ref 6.5–8.1)

## 2018-03-12 LAB — URINALYSIS, COMPLETE (UACMP) WITH MICROSCOPIC
Bilirubin Urine: NEGATIVE
Glucose, UA: NEGATIVE mg/dL
Ketones, ur: NEGATIVE mg/dL
Nitrite: NEGATIVE
PH: 5 (ref 5.0–8.0)
Protein, ur: 30 mg/dL — AB
Specific Gravity, Urine: 1.015 (ref 1.005–1.030)
WBC, UA: >50 WBC/hpf — ABNORMAL HIGH (ref 0–5)

## 2018-03-12 LAB — TROPONIN I: Troponin I: 0.03 ng/mL (ref <0.03)

## 2018-03-12 MED ORDER — SODIUM CHLORIDE 0.9 % IV SOLN
1.0000 g | Freq: Once | INTRAVENOUS | Status: AC
  Administered 2018-03-12: 1 g via INTRAVENOUS
  Filled 2018-03-12: qty 10

## 2018-03-12 MED ORDER — SENNA 8.6 MG PO TABS
1.0000 | ORAL_TABLET | Freq: Every day | ORAL | Status: DC
  Administered 2018-03-13 – 2018-03-15 (×3): 8.6 mg via ORAL
  Filled 2018-03-12 (×3): qty 1

## 2018-03-12 MED ORDER — LACTULOSE 10 GM/15ML PO SOLN
20.0000 g | Freq: Two times a day (BID) | ORAL | Status: DC
  Administered 2018-03-13 – 2018-03-15 (×5): 20 g via ORAL
  Filled 2018-03-12 (×5): qty 30

## 2018-03-12 MED ORDER — POLYETHYLENE GLYCOL 3350 17 G PO PACK
17.0000 g | PACK | Freq: Every day | ORAL | Status: DC
  Administered 2018-03-14 – 2018-03-17 (×4): 17 g via ORAL
  Filled 2018-03-12 (×5): qty 1

## 2018-03-12 NOTE — ED Notes (Signed)
Attempted to call report after several attempts and informed that they could not take patient at this time. Stating they have a lot going on and that the patient would have to wait. Also stated that [facility] is aware and informed them (2A) to let us (ED) know. Will inform Charge RN Butch at this time.

## 2018-03-12 NOTE — H&P (Addendum)
[facility] - [place] at [facility]   PATIENT NAME: Connie Beck    MR#:  [id number]  DATE OF BIRTH:  Nov 14, 1923  DATE OF ADMISSION:  03/12/2018  PRIMARY CARE PHYSICIAN: [facility]   REQUESTING/REFERRING PHYSICIAN: dr Roxan Hockey  CHIEF COMPLAINT:   syncope HISTORY OF PRESENT ILLNESS:  Connie Beck  is a 84 y.o. female with a known history of hypertension not currently on medications who presents today to the emergency room due to shortness of breath and syncope.  As for her shortness of breath this is completely resolved and she is not hypoxic on room air.  However, patient's daughter-in-law who takes care of the patient states that over the past few days she has had several syncopal episodes.  She reports when she goes from lying to sitting she passes out.  Patient is asymptomatic prior to this or afterwards.  In the emergency room laboratory work is on remarkable.  Blood pressure is normal and CT of the head is normal. Patient has no focal neurological deficits.  She was hospitalized in November for syncope due to acute kidney injury from UTI.  We are awaiting urine analysis results.  PAST MEDICAL HISTORY:   Past Medical History:  Diagnosis Date  . Dizzy spells   . Hypertension     PAST SURGICAL HISTORY:   Past Surgical History:  Procedure Laterality Date  . FRACTURE SURGERY  2018   pelvis    SOCIAL HISTORY:   Social History   Tobacco Use  . Smoking status: Never Smoker  . Smokeless tobacco: Never Used  Substance Use Topics  . Alcohol use: Never    Frequency: Never    FAMILY HISTORY:  Unknown history as per patient report  DRUG ALLERGIES:   Allergies  Allergen Reactions  . Darvon [Propoxyphene]   . Penicillins     Has patient had a PCN reaction causing immediate rash, facial/tongue/throat swelling, SOB or lightheadedness with hypotension: Unknown Has patient had a PCN reaction causing severe rash involving mucus  membranes or skin necrosis: Unknown Has patient had a PCN reaction that required hospitalization: Unknown Has patient had a PCN reaction occurring within the last 10 years: No If all of the above answers are "NO", then may proceed with Cephalosporin use.  . Sulfa Antibiotics     REVIEW OF SYSTEMS:   Review of Systems  Constitutional: Positive for malaise/fatigue. Negative for chills and fever.  HENT: Negative.  Negative for ear discharge, ear pain, hearing loss, nosebleeds and sore throat.   Eyes: Negative.  Negative for blurred vision and pain.  Respiratory: Negative.  Negative for cough, hemoptysis, shortness of breath and wheezing.   Cardiovascular: Negative.  Negative for chest pain, palpitations and leg swelling.       Syncope  Gastrointestinal: Positive for constipation. Negative for abdominal pain, blood in stool, diarrhea, nausea and vomiting.  Genitourinary: Negative.  Negative for dysuria.  Musculoskeletal: Negative.  Negative for back pain.  Skin: Negative.   Neurological: Negative for dizziness, tremors, speech change, focal weakness, seizures and headaches.  Endo/Heme/Allergies: Negative.  Does not bruise/bleed easily.  Psychiatric/Behavioral: Negative.  Negative for depression, hallucinations and suicidal ideas.    MEDICATIONS AT HOME:   Prior to Admission medications   Medication Sig Start Date End Date Taking? Authorizing Provider  docusate sodium (COLACE) 100 MG capsule Take 100 mg by mouth daily.   Yes [provider]  escitalopram (LEXAPRO) 5 MG tablet Take 5 mg by mouth daily.  Yes [provider]  meclizine (ANTIVERT) 25 MG tablet Take 25 mg by mouth daily.   Yes [provider]      VITAL SIGNS:  Blood pressure 123/77, pulse 75, temperature 98 F (36.7 C), temperature source Oral, resp. rate 19, height 4\' 9"  (1.448 m), weight 63.5 kg, SpO2 95 %.  PHYSICAL EXAMINATION:   Physical Exam  Constitutional: She is oriented to  person, place, and time. No distress.  HENT:  Head: Normocephalic.  Eyes: No scleral icterus.  Neck: Normal range of motion. Neck supple. No JVD present. No tracheal deviation present.  Cardiovascular: Normal rate, regular rhythm and normal heart sounds. Exam reveals no gallop and no friction rub.  No murmur heard. Pulmonary/Chest: Effort normal and breath sounds normal. No respiratory distress. She has no wheezes. She has no rales. She exhibits no tenderness.  Abdominal: Soft. Bowel sounds are normal. She exhibits no distension and no mass. There is no tenderness. There is no rebound and no guarding.  Musculoskeletal: Normal range of motion. She exhibits no edema.  Neurological: She is alert and oriented to person, place, and time.  Skin: Skin is warm. No rash noted. No erythema.  Psychiatric: She has a normal mood and affect. Judgment normal.      LABORATORY PANEL:   CBC Recent Labs  Lab 03/12/18 1039  WBC 8.9  HGB 13.0  HCT 39.6  PLT 258   ------------------------------------------------------------------------------------------------------------------  Chemistries  Recent Labs  Lab 03/12/18 1039  NA 139  K 3.9  CL 105  CO2 25  GLUCOSE 106*  BUN 27*  CREATININE 1.48*  CALCIUM 9.0  AST 22  ALT 11  ALKPHOS 77  BILITOT 0.5   ------------------------------------------------------------------------------------------------------------------  Cardiac Enzymes Recent Labs  Lab 03/12/18 1039  TROPONINI 0.03*   ------------------------------------------------------------------------------------------------------------------  RADIOLOGY:  Dg Chest 2 View  Result Date: 03/12/2018 CLINICAL DATA:  Shortness of breath. Evaluate for consolidation or edema. EXAM: CHEST - 2 VIEW COMPARISON:  None. FINDINGS: Low volume chest with trace pleural effusions seen on the lateral view. No Kerley lines or air bronchogram. Normal heart size. There is atelectasis along fissures seen  on the lateral view. IMPRESSION: 1. Trace pleural effusions. 2. No consolidation or edema. Electronically Signed   By: Marnee Spring M.D.   On: 03/12/2018 11:45   Ct Head Wo Contrast  Result Date: 03/12/2018 CLINICAL DATA:  84 year old female with shortness of breath, weakness and syncope. EXAM: CT HEAD WITHOUT CONTRAST TECHNIQUE: Contiguous axial images were obtained from the base of the skull through the vertex without intravenous contrast. COMPARISON:  None. FINDINGS: Brain: Patchy and confluent bilateral cerebral white matter hypodensity. No cortical encephalomalacia identified. No midline shift, ventriculomegaly, mass effect, evidence of mass lesion, intracranial hemorrhage or evidence of cortically based acute infarction. Gray-white matter differentiation is within normal limits throughout the brain. Vascular: Calcified atherosclerosis at the skull base. Evidence of left MCA atherosclerosis. Skull: Negative. Sinuses/Orbits: Mild sphenoid sinus mucosal thickening. Other Visualized paranasal sinuses, tympanic cavities, and mastoids are well pneumatized. Other: Postoperative changes to both globes. No acute orbit or scalp soft tissue findings. IMPRESSION: 1. No acute intracranial abnormality. 2. Mild to moderate for age cerebral white matter changes, most commonly due to chronic small vessel disease. Electronically Signed   By: Odessa Fleming M.D.   On: 03/12/2018 12:54   Dg Hip Unilat W Or Wo Pelvis 2-3 Views Right  Result Date: 03/12/2018 CLINICAL DATA:  84 year old female with shortness of breath, weakness and syncope. Right hip pain.  EXAM: DG HIP (WITH OR WITHOUT PELVIS) 2-3V RIGHT COMPARISON:  None. FINDINGS: Osteopenia. Femoral heads are normally located. Hip joint spaces are normal for age. The proximal right femur appears intact. No right side pelvis fracture; there are chronic appearing left pubic rami fractures. Proximal left femur appears grossly intact. Retained stool in the rectosigmoid colon.  Iliac artery calcified atherosclerosis. Levoconvex lumbar spine curvature is evident. IMPRESSION: No acute fracture or dislocation identified about the right hip or pelvis. Electronically Signed   By: Odessa FlemingH  Hall M.D.   On: 03/12/2018 12:56    EKG:   EKG shows left anterior fascicular block Q waves in the anterior leads  IMPRESSION AND PLAN:   84 year old female who initially presented with shortness of breath to the emergency room also has syncope.  1.  Syncope: This is likely orthostatic in nature or vasovagal. As per family request we will continue work-up including echocardiogram, carotid Doppler and check orthostatics. Start low-dose IV fluids. Physical therapy consultation and case management consultation requested. Follow telemetry and troponins.  2.  Shortness of breath: This seemed to have resolved however will check echocardiogram  3.  Chronic kidney disease stage III: Creatinine is at baseline  4. Constipation: try stool softners     All the records are reviewed and case discussed with ED provider. Management plans discussed with the patient and family and they are in agreement  CODE STATUS: FULL  TOTAL TIME TAKING CARE OF THIS PATIENT: 46 minutes.    Connie Beck M.D on 03/12/2018 at 1:25 PM  Between 7am to 6pm - Pager - [phone]  After 6pm go to [email or web address] - Social research officer, governmentpassword [employer] [email or web address]  [facility]  Office  [phone]  CC: Primary care physician; [facility]

## 2018-03-12 NOTE — ED Triage Notes (Signed)
Per EMS, pt called them complaining of difficulty breathing.  Upon arrival, pt was not observed having difficulty breathing.  En[facility] to [facility], sats decreased to 91%, and increased to 97% on 2L O2.  Pt oriented to self only.  VS:  124/76.  BS 132

## 2018-03-12 NOTE — ED Notes (Signed)
To X-ray

## 2018-03-12 NOTE — ED Notes (Signed)
Called Marijo Fileaula Labrecque legal guardian and daughter in law of pt. Informed her of pt's status and room assignment.

## 2018-03-12 NOTE — ED Notes (Signed)
Marijo FilePaula [phone]- [phone].  Password "Lucendia HerrlichFaye".

## 2018-03-12 NOTE — ED Notes (Signed)
Daughter in law at bedside.  Reports patient is weak getting up out of bed and passes out when she sits anywhere (to chair, bathroom, etc).

## 2018-03-12 NOTE — ED Provider Notes (Signed)
[facility] Emergency Department Provider Note    First MD Initiated Contact with Patient 03/12/18 1028     (approximate)  I have reviewed the triage vital signs and the nursing notes.   HISTORY  Chief Complaint Shortness of Breath    HPI Connie Beck is a 84 y.o. female history of hypertension since the ER for chief complaint of shortness of breath occurring this morning.  Patient called EMS for shortness of breath.  When EMS found the patient there is no evidence of respiratory distress.  In route she did have desaturation down to 91% with improvement on nasal cannula.  She is currently satting high 90s on room air.  Denies any chest pain.  Feels that she has lower extremity swelling.  No recent fevers.  No nausea or vomiting.  No chest pain.    Past Medical History:  Diagnosis Date  . Dizzy spells   . Hypertension    Family History  Family history unknown: Yes   Past Surgical History:  Procedure Laterality Date  . FRACTURE SURGERY  2018   pelvis   Patient Active Problem List   Diagnosis Date Noted  . ARF (acute renal failure) (HCC) 02/11/2018      Prior to Admission medications   Medication Sig Start Date End Date Taking? Authorizing Provider  docusate sodium (COLACE) 100 MG capsule Take 100 mg by mouth daily.   Yes [provider]  escitalopram (LEXAPRO) 5 MG tablet Take 5 mg by mouth daily.   Yes [provider]  meclizine (ANTIVERT) 25 MG tablet Take 25 mg by mouth daily.   Yes [provider]    Allergies Darvon [propoxyphene]; Penicillins; and Sulfa antibiotics    Social History Social History   Tobacco Use  . Smoking status: Never Smoker  . Smokeless tobacco: Never Used  Substance Use Topics  . Alcohol use: Never    Frequency: Never  . Drug use: Never    Review of Systems Patient denies headaches, rhinorrhea, blurry vision, numbness, shortness of breath, chest pain, edema, cough, abdominal  pain, nausea, vomiting, diarrhea, dysuria, fevers, rashes or hallucinations unless otherwise stated above in HPI. ____________________________________________   PHYSICAL EXAM:  VITAL SIGNS: Vitals:   03/12/18 1031  BP: 123/77  Pulse: 75  Resp: 19  Temp: 98 F (36.7 C)  SpO2: 95%    Constitutional: Alert and oriented. Very hard of hearing Eyes: Conjunctivae are normal.  Head: Atraumatic. Nose: No congestion/rhinnorhea. Mouth/Throat: Mucous membranes are moist.   Neck: No stridor. Painless ROM.  Cardiovascular: Normal rate, regular rhythm. Grossly normal heart sounds.  Good peripheral circulation. Respiratory: Normal respiratory effort.  No retractions. Lungs CTAB. Gastrointestinal: Soft and nontender. No distention. No abdominal bruits. No CVA tenderness. Genitourinary:  Musculoskeletal: No lower extremity tenderness nor edema.  No joint effusions. Neurologic:  Normal speech and language. No gross focal neurologic deficits are appreciated. No facial droop Skin:  Skin is warm, dry and intact. No rash noted. Psychiatric: Mood and affect are normal. Speech and behavior are normal.  ____________________________________________   LABS (all labs ordered are listed, but only abnormal results are displayed)  Results for orders placed or performed during the hospital encounter of 03/12/18 (from the past 24 hour(s))  CBC with Differential/Platelet     Status: Abnormal   Collection Time: 03/12/18 10:39 AM  Result Value Ref Range   WBC 8.9 4.0 - 10.5 K/uL   RBC 4.05 3.87 - 5.11 MIL/uL   Hemoglobin 13.0 12.0 -  15.0 g/dL   HCT 91.439.6 78.236.0 - 95.646.0 %   MCV 97.8 80.0 - 100.0 fL   MCH 32.1 26.0 - 34.0 pg   MCHC 32.8 30.0 - 36.0 g/dL   RDW 21.312.6 08.611.5 - 57.815.5 %   Platelets 258 150 - 400 K/uL   nRBC 0.0 0.0 - 0.2 %   Neutrophils Relative % 61 %   Neutro Abs 5.4 1.7 - 7.7 K/uL   Lymphocytes Relative 27 %   Lymphs Abs 2.4 0.7 - 4.0 K/uL   Monocytes Relative 7 %   Monocytes Absolute 0.6 0.1  - 1.0 K/uL   Eosinophils Relative 4 %   Eosinophils Absolute 0.4 0.0 - 0.5 K/uL   Basophils Relative 0 %   Basophils Absolute 0.0 0.0 - 0.1 K/uL   Immature Granulocytes 1 %   Abs Immature Granulocytes 0.08 (H) 0.00 - 0.07 K/uL  Comprehensive metabolic panel     Status: Abnormal   Collection Time: 03/12/18 10:39 AM  Result Value Ref Range   Sodium 139 135 - 145 mmol/L   Potassium 3.9 3.5 - 5.1 mmol/L   Chloride 105 98 - 111 mmol/L   CO2 25 22 - 32 mmol/L   Glucose, Bld 106 (H) 70 - 99 mg/dL   BUN 27 (H) 8 - 23 mg/dL   Creatinine, Ser 4.691.48 (H) 0.44 - 1.00 mg/dL   Calcium 9.0 8.9 - 62.910.3 mg/dL   Total Protein 7.1 6.5 - 8.1 g/dL   Albumin 3.2 (L) 3.5 - 5.0 g/dL   AST 22 15 - 41 U/L   ALT 11 0 - 44 U/L   Alkaline Phosphatase 77 38 - 126 U/L   Total Bilirubin 0.5 0.3 - 1.2 mg/dL   GFR calc non Af Amer 30 (L) >60 mL/min   GFR calc Af Amer 35 (L) >60 mL/min   Anion gap 9 5 - 15  Troponin I - ONCE - STAT     Status: Abnormal   Collection Time: 03/12/18 10:39 AM  Result Value Ref Range   Troponin I 0.03 (HH) <0.03 ng/mL   ____________________________________________  EKG My review and personal interpretation at Time: 10:34   Indication: syncope  Rate: 75  Rhythm: sinus Axis: normal Other: normal intervals, onsepcific st abn, abn ekg ____________________________________________  RADIOLOGY  I personally reviewed all radiographic images ordered to evaluate for the above acute complaints and reviewed radiology reports and findings.  These findings were personally discussed with the patient.  Please see medical record for radiology report.  ____________________________________________   PROCEDURES  Procedure(s) performed:  Procedures    Critical Care performed: no ____________________________________________   INITIAL IMPRESSION / ASSESSMENT AND PLAN / ED COURSE  Pertinent labs & imaging results that were available during my care of the patient were reviewed by me and  considered in my medical decision making (see chart for details).   DDX: Asthma, copd, CHF, pna, ptx, malignancy, Pe, anemia   Connie Beck is a 84 y.o. who presents to the ED with symptoms as described above.  Patient nontoxic-appearing but elderly with multiple chronic comorbidities.  The patient will be placed on continuous pulse oximetry and telemetry for monitoring.  Laboratory evaluation will be sent to evaluate for the above complaints.     Clinical Course as of Mar 13 1307  Wed Mar 12, 2018  1201 This patient with report now family states that she has been having syncopal episodes at home anytime she sits up she passes out.  No medication changes.   [  PR]    Clinical Course User Index [PR] Willy Eddy, MD   CT head shows no evidence of acute intracranial abnormality.  Right hip shows no evidence of fracture but given her chronic medical conditions with features concerning for syncope do feel patient would benefit from observation in the hospital on telemetry.   As part of my medical decision making, I reviewed the following data within the electronic MEDICAL RECORD NUMBER Nursing notes reviewed and incorporated, Labs reviewed, notes from prior ED visits and [place] Controlled Substance Database   ____________________________________________   FINAL CLINICAL IMPRESSION(S) / ED DIAGNOSES  Final diagnoses:  Syncope, unspecified syncope type      NEW MEDICATIONS STARTED DURING THIS VISIT:  New Prescriptions   No medications on file     Note:  This document was prepared using Dragon voice recognition software and may include unintentional dictation errors.    Willy Eddy, MD 03/12/18 1308

## 2018-03-12 NOTE — ED Notes (Signed)
Attempted to call report, placed on hold multiple times will continue to try and give report

## 2018-03-12 NOTE — ED Notes (Signed)
Pt unable to stand for standing orthostatics.  Pt unable to weight bear at all.

## 2018-03-12 NOTE — ED Notes (Signed)
Connie Beck made aware of troponin of 0.03

## 2018-03-12 NOTE — ED Notes (Signed)
To Ultrasound

## 2018-03-12 NOTE — ED Notes (Addendum)
Received call from RN Elon JesterMichele and she stated they were ready for report on patient and report given at this time.

## 2018-03-12 NOTE — Progress Notes (Signed)
Family Meeting Note  Advance Directive:no  Today a meeting took place with the Patient.and family  The following clinical team members were present during this meeting:MD  The following were discussed:Patient's diagnosis: syncope  , Patient's progosis: Unable to determine and Goals for treatment: Full Code  Additional follow-up to be provided: outpatient palliative care for code and GOC Chaplain consult for AD  Time spent during discussion:16 minutes  Connie Beck, Connie PesaSITAL, MD

## 2018-03-12 NOTE — ED Notes (Signed)
Attempted to call report, per Secretary RN in another patient room at this time and unable to take report. RN will call back shortly for report.

## 2018-03-12 NOTE — ED Notes (Signed)
Pt changed and cleaned up at this time. Pt repositioned and resting comfortably in bed at this time.

## 2018-03-13 ENCOUNTER — Observation Stay (HOSPITAL_BASED_OUTPATIENT_CLINIC_OR_DEPARTMENT_OTHER)
Admit: 2018-03-13 | Discharge: 2018-03-13 | Disposition: A | Discharge status: Home / Self Care | Payer: Medicare HMO | Attending: Internal Medicine | Admitting: Internal Medicine

## 2018-03-13 DIAGNOSIS — R55 Syncope and collapse: Secondary | ICD-10-CM | POA: Diagnosis not present

## 2018-03-13 LAB — CBC
HCT: 39.4 % (ref 36.0–46.0)
Hemoglobin: 12.6 g/dL (ref 12.0–15.0)
MCH: 31.8 pg (ref 26.0–34.0)
MCHC: 32 g/dL (ref 30.0–36.0)
MCV: 99.5 fL (ref 80.0–100.0)
Platelets: 250 10*3/uL (ref 150–400)
RBC: 3.96 MIL/uL (ref 3.87–5.11)
RDW: 12.6 % (ref 11.5–15.5)
WBC: 8.9 10*3/uL (ref 4.0–10.5)
nRBC: 0 % (ref 0.0–0.2)

## 2018-03-13 LAB — BASIC METABOLIC PANEL
Anion gap: 10 (ref 5–15)
CALCIUM: 8.9 mg/dL (ref 8.9–10.3)
CO2: 19 mmol/L — ABNORMAL LOW (ref 22–32)
Chloride: 113 mmol/L — ABNORMAL HIGH (ref 98–111)
Creatinine, Ser: 1.36 mg/dL — ABNORMAL HIGH (ref 0.44–1.00)
GFR calc Af Amer: 39 mL/min — ABNORMAL LOW (ref >60)
GFR calc non Af Amer: 34 mL/min — ABNORMAL LOW (ref >60)
Glucose, Bld: 78 mg/dL (ref 70–99)
Potassium: 4.8 mmol/L (ref 3.5–5.1)
Sodium: 142 mmol/L (ref 135–145)

## 2018-03-13 LAB — TROPONIN I
Troponin I: 0.03 ng/mL (ref <0.03)
Troponin I: 0.03 ng/mL (ref <0.03)
Troponin I: <0.03 ng/mL (ref <0.03)

## 2018-03-13 LAB — ECHOCARDIOGRAM COMPLETE
Height: 57 in
Weight: 2240 oz

## 2018-03-13 MED ORDER — CEPHALEXIN 250 MG PO CAPS: 250.0000 mg | ORAL_CAPSULE | Freq: Two times a day (BID) | ORAL | 0 refills | Status: DC

## 2018-03-13 MED ORDER — SENNA 8.6 MG PO TABS: 1.0000 | ORAL_TABLET | Freq: Every day | ORAL | Status: DC

## 2018-03-13 MED ORDER — SODIUM CHLORIDE 0.9 % IV SOLN
1.0000 g | INTRAVENOUS | Status: DC
  Administered 2018-03-13 – 2018-03-14 (×2): 1 g via INTRAVENOUS
  Filled 2018-03-13 (×3): qty 10

## 2018-03-13 MED ORDER — HEPARIN SODIUM (PORCINE) 5000 UNIT/ML IJ SOLN
5000.0000 [IU] | Freq: Three times a day (TID) | INTRAMUSCULAR | Status: DC
  Administered 2018-03-13 – 2018-03-17 (×14): 5000 [IU] via SUBCUTANEOUS
  Filled 2018-03-13 (×14): qty 1

## 2018-03-13 MED ORDER — POLYETHYLENE GLYCOL 3350 17 G PO PACK: 17.0000 g | PACK | Freq: Every day | ORAL | Status: DC | PRN

## 2018-03-13 MED ORDER — ESCITALOPRAM OXALATE 10 MG PO TABS
5.0000 mg | ORAL_TABLET | Freq: Every day | ORAL | Status: DC
  Administered 2018-03-13 – 2018-03-17 (×5): 5 mg via ORAL
  Filled 2018-03-13 (×5): qty 0.5

## 2018-03-13 MED ORDER — ONDANSETRON HCL 4 MG PO TABS: 4.0000 mg | ORAL_TABLET | Freq: Four times a day (QID) | ORAL | Status: DC | PRN

## 2018-03-13 MED ORDER — DOCUSATE SODIUM 100 MG PO CAPS
100.0000 mg | ORAL_CAPSULE | Freq: Every day | ORAL | Status: DC
  Administered 2018-03-13 – 2018-03-17 (×5): 100 mg via ORAL
  Filled 2018-03-13 (×5): qty 1

## 2018-03-13 MED ORDER — ONDANSETRON HCL 4 MG/2ML IJ SOLN: 4.0000 mg | Freq: Four times a day (QID) | INTRAMUSCULAR | Status: DC | PRN

## 2018-03-13 MED ORDER — MECLIZINE HCL 25 MG PO TABS
25.0000 mg | ORAL_TABLET | Freq: Every day | ORAL | Status: DC
  Administered 2018-03-13 – 2018-03-17 (×5): 25 mg via ORAL
  Filled 2018-03-13 (×5): qty 1

## 2018-03-13 MED ORDER — ENOXAPARIN SODIUM 40 MG/0.4ML ~~LOC~~ SOLN: 40.0000 mg | SUBCUTANEOUS | Status: DC

## 2018-03-13 MED ORDER — ACETAMINOPHEN 650 MG RE SUPP: 650.0000 mg | Freq: Four times a day (QID) | RECTAL | Status: DC | PRN

## 2018-03-13 MED ORDER — ACETAMINOPHEN 325 MG PO TABS: 650.0000 mg | ORAL_TABLET | Freq: Four times a day (QID) | ORAL | Status: DC | PRN

## 2018-03-13 MED ORDER — HEPARIN SODIUM (PORCINE) 5000 UNIT/ML IJ SOLN: 5000.0000 [IU] | Freq: Three times a day (TID) | INTRAMUSCULAR | Status: DC

## 2018-03-13 MED ORDER — SODIUM CHLORIDE 0.9 % IV SOLN
INTRAVENOUS | Status: DC
  Administered 2018-03-13 – 2018-03-16 (×5): via INTRAVENOUS

## 2018-03-13 NOTE — Care Management Note (Signed)
Case Management Note  Patient Details  Name: Connie Beck MRN: [id number] Date of Birth: 15-Jul-1923  Subjective/Objective:        Patient is from home with daughter in law who is the HPOA.  Placed in observation for Syncope.  Daughter in law states she is concerned about taking her home.  States she is too weak.  Patient continues with positive orthostatic BP's.   PT has recommended SNF placement.  Daughter in law agrees to SNF.  Spoke with Fredric MareBailey, CSW.  She is aware of recommendation.  Presented MOON letter to daughter in law.  Denies difficulties obtaining medications or with medical care.   Current with PCP.  Does not walk with walker but sits in the walker and daughter pushes her.  Will continue to follow through discharge.          Action/Plan:   Expected Discharge Date:  03/13/18               Expected Discharge Plan:  Skilled Nursing Facility  In-House Referral:     Discharge planning Services  CM Consult  Post Acute Care Choice:    Choice offered to:     DME Arranged:    DME Agency:     HH Arranged:    HH Agency:     Status of Service:  In process, will continue to follow  If discussed at Long Length of Stay Meetings, dates discussed:    Additional Comments:  Sherren KernsJennifer L Alhaji Mcneal, RN 03/13/2018, 2:28 PM

## 2018-03-13 NOTE — Clinical Social Work Note (Signed)
Clinical Social Work Assessment  Patient Details  Name: Belvia Gotschall MRN: [id number] Date of Birth: November 10, 1923  Date of referral:  03/13/18               Reason for consult:  Facility Placement                Permission sought to share information with:  Chartered certified accountant granted to share information::  Yes, Verbal Permission Granted  Name::      [place]::   [place]   Relationship::     Contact Information:     Housing/Transportation Living arrangements for the past 2 months:  [place] of Information:  Patient, Other (Comment Required)(Daughter in Sports coach Marceline. ) Patient Interpreter Needed:  None Criminal Activity/Legal Involvement Pertinent to Current Situation/Hospitalization:  No - Comment as needed Significant Relationships:  Other(Comment)(Daughter in law ) Lives with:  Other (Comment)(Daughter in law ) Do you feel safe going back to the place where you live?  Yes Need for family participation in patient care:  Yes (Comment)  Care giving concerns:  Patient lives in [place] with her daughter in law/ [place] cell # [phone], home # [phone].    Social Worker assessment / plan:  Holiday representative (CSW) received verbal consult from MD that patient's daughter in law wants patient to go to SNF. PT is recommending SNF. CSW met with patient and her daughter in law Nevin Bloodgood was at bedside. Patient did not participate in assessment. Per Nevin Bloodgood patient lives with her in [place] and patient's son (Paula's former husband) has passed away. Nevin Bloodgood remarried and her husband Quillian Quince also lives in the home. CSW explained SNF process and that [facility] will have to approve it. Nevin Bloodgood is agreeable to SNF search in [facility] and prefers [facility]. FL2 complete and faxed out. CSW will continue to follow and assist as needed.   Employment status:  Retired Nurse, adult PT Recommendations:   [place] / Referral to community resources:  [place]  Patient/Family's Response to care:  Patient's daughter in law Nevin Bloodgood is agreeable to SNF search in [place].   Patient/Family's Understanding of and Emotional Response to Diagnosis, Current Treatment, and Prognosis:  Patient's daughter in law was very pleasant and thanked CSW for assistance.   Emotional Assessment Appearance:  Appears stated age Attitude/Demeanor/Rapport:    Affect (typically observed):  Accepting, Adaptable, Pleasant Orientation:  Oriented to Self, Oriented to Place, Oriented to  Time, Fluctuating Orientation (Suspected and/or reported Sundowners) Alcohol / Substance use:  Not Applicable Psych involvement (Current and /or in the community):  No (Comment)  Discharge Needs  Concerns to be addressed:  Discharge Planning Concerns Readmission within the last 30 days:  No Current discharge risk:  Dependent with Mobility Barriers to Discharge:  Continued Medical Work up   [employer], Veronia Beets, LCSW 03/13/2018, 3:26 PM

## 2018-03-13 NOTE — Clinical Social Work Note (Signed)
CSW received referral for SNF.  Case discussed with case manager and plan is to discharge home with home health.  CSW to sign off please re-consult if social work needs arise.  Coline Calkin R. Layne Lebon, MSW, LCSWA [phone]  

## 2018-03-13 NOTE — [place] FL2 (Signed)
[place] MEDICAID FL2 LEVEL OF CARE SCREENING TOOL     IDENTIFICATION  Patient Name: Connie Beck Birthdate: [birth year] Sex: female Admission Date (Current Location): 03/12/2018  [place] and [place] Number:  Chiropodist and Address:  [facility], [address] [place], [place] [address]      Provider Number: [id number]  Attending Physician Name and Address:  Adrian Saran, MD  Relative Name and Phone Number:       Current Level of Care: Hospital Recommended Level of Care:   Prior Approval Number:    Date Approved/Denied:   PASRR Number: [id number])  Discharge Plan: SNF    Current Diagnoses: Patient Active Problem List   Diagnosis Date Noted  . Syncope 03/12/2018  . ARF (acute renal failure) (HCC) 02/11/2018    Orientation RESPIRATION BLADDER Height & Weight     Self, Place  Normal Continent Weight: 140 lb (63.5 kg) Height:  4\' 9"  (144.8 cm)  BEHAVIORAL SYMPTOMS/MOOD NEUROLOGICAL BOWEL NUTRITION STATUS      Continent Diet(Diet: Regular )  AMBULATORY STATUS COMMUNICATION OF NEEDS Skin   Extensive Assist Verbally Normal                       Personal Care Assistance Level of Assistance  Bathing, Feeding, Dressing Bathing Assistance: Limited assistance Feeding assistance: Independent Dressing Assistance: Limited assistance     Functional Limitations Info  Sight, Hearing, Speech Sight Info: Impaired Hearing Info: Adequate Speech Info: Adequate    SPECIAL CARE FACTORS FREQUENCY  PT (By licensed PT), OT (By licensed OT)     PT Frequency: (5) OT Frequency: (5)            Contractures      Additional Factors Info  Code Status, Allergies Code Status Info: (Full Code. ) Allergies Info: (Darvon Propoxyphene, Penicillins, Sulfa Antibiotics)           Current Medications (03/13/2018):  This is the current hospital active medication list Current Facility-Administered Medications  Medication Dose Route  Frequency Provider Last Rate Last Dose  . 0.9 %  sodium chloride infusion   Intravenous Continuous Adrian Saran, MD 50 mL/hr at 03/13/18 0149    . acetaminophen (TYLENOL) tablet 650 mg  650 mg Oral Q6H PRN Adrian Saran, MD       Or  . acetaminophen (TYLENOL) suppository 650 mg  650 mg Rectal Q6H PRN Mody, Sital, MD      . cefTRIAXone (ROCEPHIN) 1 g in sodium chloride 0.9 % 100 mL IVPB  1 g Intravenous Q24H Cammy Copa, MD      . docusate sodium (COLACE) capsule 100 mg  100 mg Oral Daily Adrian Saran, MD   100 mg at 03/13/18 0947  . escitalopram (LEXAPRO) tablet 5 mg  5 mg Oral Daily Adrian Saran, MD   5 mg at 03/13/18 0947  . heparin injection 5,000 Units  5,000 Units Subcutaneous Q8H Cammy Copa, MD   5,000 Units at 03/13/18 [phone]  . lactulose (CHRONULAC) 10 GM/15ML solution 20 g  20 g Oral BID Adrian Saran, MD   20 g at 03/13/18 0949  . meclizine (ANTIVERT) tablet 25 mg  25 mg Oral Daily Adrian Saran, MD   25 mg at 03/13/18 0948  . ondansetron (ZOFRAN) tablet 4 mg  4 mg Oral Q6H PRN Mody, Sital, MD       Or  . ondansetron (ZOFRAN) injection 4 mg  4 mg Intravenous Q6H PRN Adrian Saran, MD      .  polyethylene glycol (MIRALAX / GLYCOLAX) packet 17 g  17 g Oral Daily PRN Mody, Sital, MD      . polyethylene glycol (MIRALAX / GLYCOLAX) packet 17 g  17 g Oral Daily Mody, Sital, MD      . senna (SENOKOT) tablet 8.6 mg  1 tablet Oral Daily Adrian SaranMody, Sital, MD   8.6 mg at 03/13/18 [id number]     Discharge Medications: Please see discharge summary for a list of discharge medications.  Relevant Imaging Results:  Relevant Lab Results:   Additional Information (SSN: [id number])  Skilynn Durney, Darleen CrockerBailey M, LCSW

## 2018-03-13 NOTE — Care Management Obs Status (Signed)
MEDICARE OBSERVATION STATUS NOTIFICATION   Patient Details  Name: Connie Beck MRN: [id number] Date of Birth: Mar 11, 1924   Medicare Observation Status Notification Given:  Yes    Sherren KernsJennifer L Amylynn Fano, RN 03/13/2018, 2:51 PM

## 2018-03-13 NOTE — Clinical Social Work Placement (Signed)
   CLINICAL SOCIAL WORK PLACEMENT  NOTE  Date:  03/13/2018  Patient Details  Name: Connie CoilFaith Amendola MRN: [id number] Date of Birth: [birth year]  Clinical Social Work is seeking post-discharge placement for this patient at the Skilled  Nursing Facility level of care (*CSW will initial, date and re-position this form in  chart as items are completed):  Yes   Patient/family provided with [facility] Clinical Social Work Department's list of facilities offering this level of care within the geographic area requested by the patient (or if unable, by the patient's family).  Yes   Patient/family informed of their freedom to choose among providers that offer the needed level of care, that participate in Medicare, Medicaid or managed care program needed by the patient, have an available bed and are willing to accept the patient.  Yes   Patient/family informed of 's ownership interest in [facility] and [facility], as well as of the fact that they are under no obligation to receive care at these facilities.  PASRR submitted to EDS on 03/13/18     PASRR number received on 03/13/18     Existing PASRR number confirmed on       FL2 transmitted to all facilities in geographic area requested by pt/family on 03/13/18     FL2 transmitted to all facilities within larger geographic area on       Patient informed that his/her managed care company has contracts with or will negotiate with certain facilities, including the following:            Patient/family informed of bed offers received.  Patient chooses bed at       Physician recommends and patient chooses bed at      Patient to be transferred to   on  .  Patient to be transferred to facility by       Patient family notified on   of transfer.  Name of family member notified:        PHYSICIAN       Additional Comment:    _______________________________________________ Darya Bigler, Darleen CrockerBailey M, LCSW 03/13/2018, 3:23 PM

## 2018-03-13 NOTE — Progress Notes (Signed)
*  PRELIMINARY RESULTS* Echocardiogram 2D Echocardiogram has been performed.  Cristela BlueHege, Vernia Teem 03/13/2018, 2:44 PM

## 2018-03-13 NOTE — Progress Notes (Signed)
Family would like SNF D/w CSW Medically stable for discharge after ECHO [facility] [place] [facility]   PATIENT NAME: Connie Beck    MR#:  [id number]   DATE OF BIRTH:  [birth year]  SUBJECTIVE:  No issues overnight  REVIEW OF SYSTEMS:    Review of Systems  Constitutional: Negative for fever, chills weight loss HENT: Negative for ear pain, nosebleeds, congestion, facial swelling, rhinorrhea, neck pain, neck stiffness and ear discharge.   Respiratory: Negative for cough, shortness of breath, wheezing  Cardiovascular: Negative for chest pain, palpitations and leg swelling.  Gastrointestinal: Negative for heartburn, abdominal pain, vomiting, diarrhea or consitpation Genitourinary: Negative for dysuria, urgency, frequency, hematuria Musculoskeletal: Negative for back pain or joint pain Neurological: Negative for dizziness, seizures, syncope, focal weakness,  numbness and headaches.  Hematological: Does not bruise/bleed easily.  Psychiatric/Behavioral: Negative for hallucinations, confusion, dysphoric mood    Tolerating Diet: yes      DRUG ALLERGIES:   Allergies  Allergen Reactions  . Darvon [Propoxyphene]   . Penicillins     Has patient had a PCN reaction causing immediate rash, facial/tongue/throat swelling, SOB or lightheadedness with hypotension: Unknown Has patient had a PCN reaction causing severe rash involving mucus membranes or skin necrosis: Unknown Has patient had a PCN reaction that required hospitalization: Unknown Has patient had a PCN reaction occurring within the last 10 years: No If all of the above answers are "NO", then may proceed with Cephalosporin use.  . Sulfa Antibiotics     VITALS:  Blood pressure (!) 177/69, pulse 62, temperature 97.7 F (36.5 C), resp. rate 18, height 4\' 9"  (1.448 m), weight 63.5 kg, SpO2 97 %.  PHYSICAL EXAMINATION:  Constitutional: Appears well-developed and well-nourished. No distress. HENT:  Normocephalic. Marland Kitchen Oropharynx is clear and moist.  Eyes: Conjunctivae and EOM are normal. PERRLA, no scleral icterus.  Neck: Normal ROM. Neck supple. No JVD. No tracheal deviation. CVS: RRR, S1/S2 +, no murmurs, no gallops, no carotid bruit.  Pulmonary: Effort and breath sounds normal, no stridor, rhonchi, wheezes, rales.  Abdominal: Soft. BS +,  no distension, tenderness, rebound or guarding.  Musculoskeletal: Normal range of motion. No edema and no tenderness.  Neuro: Alert. CN 2-12 grossly intact. No focal deficits. Skin: Skin is warm and dry. No rash noted. Psychiatric: Normal mood and affect.      LABORATORY PANEL:   CBC Recent Labs  Lab 03/13/18 0018  WBC 8.9  HGB 12.6  HCT 39.4  PLT 250   ------------------------------------------------------------------------------------------------------------------  Chemistries  Recent Labs  Lab 03/12/18 1039 03/13/18 0619  NA 139 142  K 3.9 4.8  CL 105 113*  CO2 25 19*  GLUCOSE 106* 78  BUN 27* SHORT SAMPLED  CREATININE 1.48* 1.36*  CALCIUM 9.0 8.9  AST 22  --   ALT 11  --   ALKPHOS 77  --   BILITOT 0.5  --    ------------------------------------------------------------------------------------------------------------------  Cardiac Enzymes Recent Labs  Lab 03/13/18 0018 03/13/18 0619 03/13/18 1220  TROPONINI 0.03* 0.03* <0.03   ------------------------------------------------------------------------------------------------------------------  RADIOLOGY:  Dg Chest 2 View  Result Date: 03/12/2018 CLINICAL DATA:  Shortness of breath. Evaluate for consolidation or edema. EXAM: CHEST - 2 VIEW COMPARISON:  None. FINDINGS: Low volume chest with trace pleural effusions seen on the lateral view. No Kerley lines or air bronchogram. Normal heart size. There is atelectasis along fissures seen on the lateral view. IMPRESSION: 1. Trace pleural effusions. 2. No consolidation or edema. Electronically Signed   By:  Marnee SpringJonathon  Watts  M.D.   On: 03/12/2018 11:45   Dg Abd 1 View  Result Date: 03/12/2018 CLINICAL DATA:  Constipation EXAM: ABDOMEN - 1 VIEW COMPARISON:  None. FINDINGS: Moderate degree of formed stool, mainly seen at the rectum where there is desiccated stool distending the lumen to 7 cm. No small bowel dilatation. Atherosclerotic calcification. Prominent osteopenia. Scoliosis. IMPRESSION: Moderate stool volume most notable at the distended rectum. Electronically Signed   By: Marnee SpringJonathon  Watts M.D.   On: 03/12/2018 15:34   Ct Head Wo Contrast  Result Date: 03/12/2018 CLINICAL DATA:  84 year old female with shortness of breath, weakness and syncope. EXAM: CT HEAD WITHOUT CONTRAST TECHNIQUE: Contiguous axial images were obtained from the base of the skull through the vertex without intravenous contrast. COMPARISON:  None. FINDINGS: Brain: Patchy and confluent bilateral cerebral white matter hypodensity. No cortical encephalomalacia identified. No midline shift, ventriculomegaly, mass effect, evidence of mass lesion, intracranial hemorrhage or evidence of cortically based acute infarction. Gray-white matter differentiation is within normal limits throughout the brain. Vascular: Calcified atherosclerosis at the skull base. Evidence of left MCA atherosclerosis. Skull: Negative. Sinuses/Orbits: Mild sphenoid sinus mucosal thickening. Other Visualized paranasal sinuses, tympanic cavities, and mastoids are well pneumatized. Other: Postoperative changes to both globes. No acute orbit or scalp soft tissue findings. IMPRESSION: 1. No acute intracranial abnormality. 2. Mild to moderate for age cerebral white matter changes, most commonly due to chronic small vessel disease. Electronically Signed   By: Odessa FlemingH  Hall M.D.   On: 03/12/2018 12:54   [place] Carotid Bilateral  Result Date: 03/12/2018 CLINICAL DATA:  CVA EXAM: BILATERAL CAROTID DUPLEX ULTRASOUND TECHNIQUE: Wallace CullensGray scale imaging, color Doppler and duplex ultrasound were performed of  bilateral carotid and vertebral arteries in the neck. COMPARISON:  None. FINDINGS: Criteria: Quantification of carotid stenosis is based on velocity parameters that correlate the residual internal carotid diameter with NASCET-based stenosis levels, using the diameter of the distal internal carotid lumen as the denominator for stenosis measurement. The following velocity measurements were obtained: RIGHT ICA: 59 cm/sec CCA: 44 cm/sec SYSTOLIC ICA/CCA RATIO:  1.3 ECA: 55 cm/sec LEFT ICA: 64 cm/sec CCA: 45 cm/sec SYSTOLIC ICA/CCA RATIO:  1.4 ECA: 66 cm/sec RIGHT CAROTID ARTERY: There is mild calcified plaque in the bulb. Low resistance internal carotid Doppler pattern is preserved. RIGHT VERTEBRAL ARTERY:  Antegrade. LEFT CAROTID ARTERY: Minimal calcified plaque in the bulb. Low resistance internal carotid Doppler pattern is preserved. LEFT VERTEBRAL ARTERY:  Antegrade. IMPRESSION: Less than 50% stenosis in the right and left internal carotid arteries. Electronically Signed   By: Jolaine ClickArthur  Hoss M.D.   On: 03/12/2018 15:24   Dg Hip Unilat W Or Wo Pelvis 2-3 Views Right  Result Date: 03/12/2018 CLINICAL DATA:  84 year old female with shortness of breath, weakness and syncope. Right hip pain. EXAM: DG HIP (WITH OR WITHOUT PELVIS) 2-3V RIGHT COMPARISON:  None. FINDINGS: Osteopenia. Femoral heads are normally located. Hip joint spaces are normal for age. The proximal right femur appears intact. No right side pelvis fracture; there are chronic appearing left pubic rami fractures. Proximal left femur appears grossly intact. Retained stool in the rectosigmoid colon. Iliac artery calcified atherosclerosis. Levoconvex lumbar spine curvature is evident. IMPRESSION: No acute fracture or dislocation identified about the right hip or pelvis. Electronically Signed   By: Odessa FlemingH  Hall M.D.   On: 03/12/2018 12:56     ASSESSMENT AND PLAN:   84 year old female who initially presented with shortness of breath to the emergency room also  has syncope.  1. Syncope: Syncopal events is due to UTI with mild dehydration.   Carotid Doppler did not show evidence of hemodynamically significant stenosis.   ECHO pending  2. Shortness of breath: This hs resolved.  3. Chronic kidney disease stage III: Creatinine is at baseline  4. Constipation:resolved  PT RECs SNF family wants SNF D/w CSW Has [employer] await approval  Management plans discussed with the patient and daughter in law is in agreement.  CODE STATUS: FULL  TOTAL TIME TAKING CARE OF THIS PATIENT: 30 minutes.     POSSIBLE D/C needs approval for SNF, DEPENDING ON CLINICAL CONDITION.   Madhuri Vacca M.D on 03/13/2018 at 1:51 PM  Between 7am to 6pm - Pager - [phone] After 6pm go to [email or web address] - Social research officer, government  [facility] [place] [facility]  Office  [phone]  CC: Primary care physician; Center, [facility]  Note: This dictation was prepared with Nurse, children's dictation along with smaller phrase technology. Any transcriptional errors that result from this process are unintentional.

## 2018-03-13 NOTE — Progress Notes (Signed)
Talked to Dr. Juliene PinaMody and clarify patient's code status, per Dr. Juliene PinaMody patient is Full code, place order and discontinue discharge order. RN will continue to monitor.

## 2018-03-13 NOTE — Progress Notes (Signed)
Clinical Child psychotherapistocial Worker (CSW) contacted patient's daughter in law [place]Paula and presented bed offers via telephone. CSW discussed the quality measures of the facilities per CMS website. Paula chose [facility]. Per Inetta Fermoina [facility] liaison she will start [facility] authorization today.   Baker Hughes IncorporatedBailey Glorimar Stroope, LCSW [phone]

## 2018-03-13 NOTE — Evaluation (Signed)
Physical Therapy Evaluation Patient Details Name: Connie Beck MRN: [id number] DOB: 1923/08/24 Today's Date: 03/13/2018   History of Present Illness  presented to ER secondary to SOB, dizzy episodes; admitted for management of orthostatic vs vasovagal syncope.  Clinical Impression  Upon evaluation, patient alert and oriented; follows simple commands.  Exceptionally HOH (hears best from L ear?).  Generally weak and deconditioned throughout all extremities; denies pain.  Currently requiring min assist for bed mobility; mod assist +2 for sit/stand and static standing balance with RW.  Poor balance reactions in all planes with standing postures; unsafe/unable to attempt stepping/gait away from bed surface at this time. Also of note, orthostatic assessment as follows: supine BP 120/59; sitting BP 69/48, HR 71; sitting x3 minutes BP 95/55, HR 70; sitting x5 minutes BP 117/89, HR 79.  Unable to tolerate standing for adequate duration to fully assess.  Appears to gradually accommodate to position change (at least to sitting) with slow transitions, increased time between transitions.  Will continue to assess in subsequent sessions. Would benefit from skilled PT to address above deficits and promote optimal return to PLOF; recommend transition to STR upon discharge from acute hospitalization.     Follow Up Recommendations SNF    Equipment Recommendations       Recommendations for Other Services       Precautions / Restrictions Precautions Precautions: Fall Restrictions Weight Bearing Restrictions: No      Mobility  Bed Mobility Overal bed mobility: Needs Assistance Bed Mobility: Supine to Sit     Supine to sit: Min assist        Transfers Overall transfer level: Needs assistance Equipment used: Rolling walker (2 wheeled) Transfers: Sit to/from Stand Sit to Stand: Mod assist;+2 physical assistance         General transfer comment: poor balance, increased sway in A/P plane; mod  assist +2 to maintain balance  Ambulation/Gait             General Gait Details: unsafe/unable  Stairs            Wheelchair Mobility    Modified Rankin (Stroke Patients Only)       Balance Overall balance assessment: Needs assistance Sitting-balance support: No upper extremity supported;Feet supported Sitting balance-Leahy Scale: Fair     Standing balance support: Bilateral upper extremity supported Standing balance-Leahy Scale: Poor                               Pertinent Vitals/Pain Pain Assessment: No/denies pain    Home Living Family/patient expects to be discharged to:: Private residence Living Arrangements: (daughter in law (who is CNA_)) Available Help at Discharge: Family;Available 24 hours/day Type of Home: House Home Access: Ramped entrance     Home Layout: One level Home Equipment: Walker - 2 wheels;Walker - 4 wheels;Bedside commode;Wheelchair - manual;Hospital bed      Prior Function Level of Independence: Needs assistance   Gait / Transfers Assistance Needed: Ambulates short household distances with rollator, family pushes WC in community.  No additional falls in the past 6 months.    ADL's / Homemaking Assistance Needed: Assist with bathing, dressing.  Family does the driving, cooking, cleaning.          Hand Dominance        Extremity/Trunk Assessment   Upper Extremity Assessment Upper Extremity Assessment: Generalized weakness    Lower Extremity Assessment Lower Extremity Assessment: Generalized weakness(grossly at least 4-/5 throughout)  Communication   Communication: HOH  Cognition Arousal/Alertness: Awake/alert Behavior During Therapy: WFL for tasks assessed/performed Overall Cognitive Status: Within Functional Limits for tasks assessed                                        General Comments      Exercises Other Exercises Other Exercises: Orthostatic assessment: supine BP  120/59; sitting BP 69/48, HR 71; sitting x3 minutes BP 95/55, HR 70; sitting x5 minutes BP 117/89, HR 79.  Unable to tolerate standing for adequate duration to fully assess BP   Assessment/Plan    PT Assessment Patient needs continued PT services  PT Problem List Decreased strength;Decreased activity tolerance;Decreased balance;Decreased mobility;Decreased coordination;Decreased knowledge of use of DME;Decreased cognition;Decreased safety awareness;Cardiopulmonary status limiting activity       PT Treatment Interventions DME instruction;Therapeutic exercise;Gait training;Functional mobility training;Therapeutic activities;Balance training;Patient/family education    PT Goals (Current goals can be found in the Care Plan section)  Acute Rehab PT Goals Patient Stated Goal: to return home with daughter- in law PT Goal Formulation: With patient Time For Goal Achievement: 03/27/18 Potential to Achieve Goals: Good Additional Goals Additional Goal #1: Assess and establish goals for OOB/gait as appropriate.    Frequency Min 2X/week   Barriers to discharge        Co-evaluation               AM-PAC PT "6 Clicks" Mobility  Outcome Measure Help needed turning from your back to your side while in a flat bed without using bedrails?: A Little Help needed moving from lying on your back to sitting on the side of a flat bed without using bedrails?: A Little Help needed moving to and from a bed to a chair (including a wheelchair)?: Total Help needed standing up from a chair using your arms (e.g., wheelchair or bedside chair)?: A Lot Help needed to walk in hospital room?: Total Help needed climbing 3-5 steps with a railing? : Total 6 Click Score: 11    End of Session Equipment Utilized During Treatment: Gait belt Activity Tolerance: (symptomatic orthostasis) Patient left: in bed;with call bell/phone within reach;with bed alarm set Nurse Communication: Mobility status PT Visit Diagnosis:  Unsteadiness on feet (R26.81);Muscle weakness (generalized) (M62.81);Difficulty in walking, not elsewhere classified (R26.2)    Time: 9629-5284 PT Time Calculation (min) (ACUTE ONLY): 27 min   Charges:   PT Evaluation $PT Eval Moderate Complexity: 1 Mod PT Treatments $Therapeutic Activity: 8-22 mins        Jostin Rue H. Manson Passey, PT, DPT, NCS 03/13/18, 10:54 AM [phone]

## 2018-03-13 NOTE — Progress Notes (Signed)
Pt to room via stretcher. Pt alert to person, hard of hearing. Telemetry monitor applied and called to CCMD. When this nurse would ask her questions, she just looked at the nurse and not say anything. Unable to do profile at this time.

## 2018-03-14 MED ORDER — INFLUENZA VAC SPLIT HIGH-DOSE 0.5 ML IM SUSY
0.5000 mL | PREFILLED_SYRINGE | INTRAMUSCULAR | Status: AC
  Administered 2018-03-17: 0.5 mL via INTRAMUSCULAR
  Filled 2018-03-14 (×2): qty 0.5

## 2018-03-14 NOTE — Progress Notes (Signed)
[facility]. Constipation resolved. Pt still showing small appetite, has not eaten more than 1/4 of tray at meal times today. Patient is resting comfortably.

## 2018-03-14 NOTE — Progress Notes (Signed)
Per Inetta Fermoina [facility] liaison [employer] [facility] is still pending. Clinical Child psychotherapistocial Worker (CSW) attempted to contact patient's daughter in law [place] IslandPa[place] however she did not answer and a voicemail was left.   Baker Hughes IncorporatedBailey Obelia Bonello, LCSW [phone]

## 2018-03-14 NOTE — Plan of Care (Signed)
Patient has moisture associated dermatitis under her arms, breasts and groin. Keep dry and clean and apply anti-fungal powder.

## 2018-03-15 MED ORDER — CEPHALEXIN 250 MG PO CAPS
250.0000 mg | ORAL_CAPSULE | Freq: Two times a day (BID) | ORAL | Status: DC
  Administered 2018-03-15 – 2018-03-17 (×4): 250 mg via ORAL
  Filled 2018-03-15 (×6): qty 1

## 2018-03-15 NOTE — Progress Notes (Addendum)
[facility] [place] [facility]   PATIENT NAME: Connie Beck    MR#:  [id number]   DATE OF BIRTH:  Feb 27, 1924  SUBJECTIVE:  No acute issues overnight.  Awaiting insurance approval  REVIEW OF SYSTEMS:    Review of Systems  Constitutional: Negative for fever, chills weight loss HENT: Negative for ear pain, nosebleeds, congestion, facial swelling, rhinorrhea, neck pain, neck stiffness and ear discharge.   Respiratory: Negative for cough, shortness of breath, wheezing  Cardiovascular: Negative for chest pain, palpitations and leg swelling.  Gastrointestinal: Negative for heartburn, abdominal pain, vomiting, diarrhea or consitpation Genitourinary: Negative for dysuria, urgency, frequency, hematuria Musculoskeletal: Negative for back pain or joint pain Neurological: Negative for dizziness, seizures, syncope, focal weakness,  numbness and headaches.  Hematological: Does not bruise/bleed easily.  Psychiatric/Behavioral: Negative for hallucinations, confusion, dysphoric mood    Tolerating Diet: yes      DRUG ALLERGIES:   Allergies  Allergen Reactions  . Darvon [Propoxyphene]   . Penicillins     Has patient had a PCN reaction causing immediate rash, facial/tongue/throat swelling, SOB or lightheadedness with hypotension: Unknown Has patient had a PCN reaction causing severe rash involving mucus membranes or skin necrosis: Unknown Has patient had a PCN reaction that required hospitalization: Unknown Has patient had a PCN reaction occurring within the last 10 years: No If all of the above answers are "NO", then may proceed with Cephalosporin use.  . Sulfa Antibiotics     VITALS:  Blood pressure (!) 136/45, pulse (!) 58, temperature 97.9 F (36.6 C), temperature source Oral, resp. rate 19, height 4\' 9"  (1.448 m), weight 63.5 kg, SpO2 95 %.  PHYSICAL EXAMINATION:  Constitutional: Appears frail. No distress. HENT: Normocephalic. Marland Kitchen Oropharynx is clear and  moist.  Hard of hearing Eyes: Conjunctivae and EOM are normal. PERRLA, no scleral icterus.  Neck: Normal ROM. Neck supple. No JVD. No tracheal deviation. CVS: RRR, S1/S2 +, no murmurs, no gallops, no carotid bruit.  Pulmonary: Effort and breath sounds normal, no stridor, rhonchi, wheezes, rales.  Abdominal: Soft. BS +,  no distension, tenderness, rebound or guarding.  Musculoskeletal:. No edema and no tenderness.  Neuro: Alert. CN 2-12 grossly intact. No focal deficits. Skin: Skin is warm and dry. No rash noted. Psychiatric: Normal mood and affect.      LABORATORY PANEL:   CBC Recent Labs  Lab 03/13/18 0018  WBC 8.9  HGB 12.6  HCT 39.4  PLT 250   ------------------------------------------------------------------------------------------------------------------  Chemistries  Recent Labs  Lab 03/12/18 1039 03/13/18 0619  NA 139 142  K 3.9 4.8  CL 105 113*  CO2 25 19*  GLUCOSE 106* 78  BUN 27* SHORT SAMPLED  CREATININE 1.48* 1.36*  CALCIUM 9.0 8.9  AST 22  --   ALT 11  --   ALKPHOS 77  --   BILITOT 0.5  --    ------------------------------------------------------------------------------------------------------------------  Cardiac Enzymes Recent Labs  Lab 03/13/18 0018 03/13/18 0619 03/13/18 1220  TROPONINI 0.03* 0.03* <0.03   ------------------------------------------------------------------------------------------------------------------  RADIOLOGY:  No results found.   ASSESSMENT AND PLAN:   84 year old female who initially presented with shortness of breath to the emergency room also has syncope.  1. Syncope: Syncopal events is due to UTI with mild dehydration.   Carotid Doppler did not show evidence of hemodynamically significant stenosis.   ECHO pending  2. Shortness of breath: This hs resolved.  3. Chronic kidney disease stage III: Creatinine is at baseline  4. Constipation:resolved 5.  UTI: Change  Rocephin to Keflex and treat for  2 more days.   Awaiting insurance approval to discharge to skilled nursing facility.   Management plans discussed with the patient and she is is in agreement.  CODE STATUS: FULL  TOTAL TIME TAKING CARE OF THIS PATIENT: 21 minutes.     POSSIBLE D/C needs approval for SNF, DEPENDING ON CLINICAL CONDITION.   Yoshi Vicencio M.D on 03/15/2018 at 12:25 PM  Between 7am to 6pm - Pager - [phone] After 6pm go to [email or web address] - Social research officer, governmentpassword EPAS [facility]  [facility] [place] [facility]  Office  [phone]  CC: Primary care physician; [facility]  Note: This dictation was prepared with Nurse, children'sDragon dictation along with smaller phrase technology. Any transcriptional errors that result from this process are unintentional.

## 2018-03-16 NOTE — Plan of Care (Signed)
  Problem: Elimination: Goal: Will not experience complications related to bowel motility Outcome: Progressing   Problem: Urinary Elimination: Goal: Signs and symptoms of infection will decrease Outcome: Progressing

## 2018-03-16 NOTE — Progress Notes (Signed)
[facility] [place] [facility]   PATIENT NAME: Connie Beck    MR#:  [id number]   DATE OF BIRTH:  May 13, 1923  SUBJECTIVE:  No acute issues overnight  REVIEW OF SYSTEMS:    Review of Systems  Constitutional: Negative for fever, chills weight loss HENT: Negative for ear pain, nosebleeds, congestion, facial swelling, rhinorrhea, neck pain, neck stiffness and ear discharge.   Respiratory: Negative for cough, shortness of breath, wheezing  Cardiovascular: Negative for chest pain, palpitations and leg swelling.  Gastrointestinal: Negative for heartburn, abdominal pain, vomiting, diarrhea or consitpation Genitourinary: Negative for dysuria, urgency, frequency, hematuria Musculoskeletal: Negative for back pain or joint pain Neurological: Negative for dizziness, seizures, syncope, focal weakness,  numbness and headaches.  Hematological: Does not bruise/bleed easily.  Psychiatric/Behavioral: Negative for hallucinations, confusion, dysphoric mood    Tolerating Diet: yes      DRUG ALLERGIES:   Allergies  Allergen Reactions  . Darvon [Propoxyphene]   . Penicillins     Has patient had a PCN reaction causing immediate rash, facial/tongue/throat swelling, SOB or lightheadedness with hypotension: Unknown Has patient had a PCN reaction causing severe rash involving mucus membranes or skin necrosis: Unknown Has patient had a PCN reaction that required hospitalization: Unknown Has patient had a PCN reaction occurring within the last 10 years: No If all of the above answers are "NO", then may proceed with Cephalosporin use.  . Sulfa Antibiotics     VITALS:  Blood pressure (!) 174/74, pulse 66, temperature 98.2 F (36.8 C), temperature source Oral, resp. rate 18, height 4\' 9"  (1.448 m), weight 63.5 kg, SpO2 97 %.  PHYSICAL EXAMINATION:  Constitutional: Appears frail. No distress. HENT: Normocephalic. Marland Kitchen Oropharynx is clear and moist.  Hard of hearing Eyes:  Conjunctivae and EOM are normal. PERRLA, no scleral icterus.  Neck: Normal ROM. Neck supple. No JVD. No tracheal deviation. CVS: RRR, S1/S2 +, no murmurs, no gallops, no carotid bruit.  Pulmonary: Effort and breath sounds normal, no stridor, rhonchi, wheezes, rales.  Abdominal: Soft. BS +,  no distension, tenderness, rebound or guarding.  Musculoskeletal:. No edema and no tenderness.  Neuro: Alert. CN 2-12 grossly intact. No focal deficits. Skin: Skin is warm and dry. No rash noted. Psychiatric: Normal mood and affect.      LABORATORY PANEL:   CBC Recent Labs  Lab 03/13/18 0018  WBC 8.9  HGB 12.6  HCT 39.4  PLT 250   ------------------------------------------------------------------------------------------------------------------  Chemistries  Recent Labs  Lab 03/12/18 1039 03/13/18 0619  NA 139 142  K 3.9 4.8  CL 105 113*  CO2 25 19*  GLUCOSE 106* 78  BUN 27* SHORT SAMPLED  CREATININE 1.48* 1.36*  CALCIUM 9.0 8.9  AST 22  --   ALT 11  --   ALKPHOS 77  --   BILITOT 0.5  --    ------------------------------------------------------------------------------------------------------------------  Cardiac Enzymes Recent Labs  Lab 03/13/18 0018 03/13/18 0619 03/13/18 1220  TROPONINI 0.03* 0.03* <0.03   ------------------------------------------------------------------------------------------------------------------  RADIOLOGY:  No results found.   ASSESSMENT AND PLAN:   84 year old female who initially presented with shortness of breath to the emergency room also has syncope.  1. Syncope: Syncopal events is due to UTI with mild dehydration.   Carotid Doppler did not show evidence of hemodynamically significant stenosis.   ECHO pending  2. Shortness of breath: This has resolved.  3. Chronic kidney disease stage III: Creatinine is at baseline  4. Constipation:resolved 5.  UTI: Change Rocephin to Keflex and treat  for 1 more days.   Awaiting  insurance approval to discharge to skilled nursing facility.   Management plans discussed with the patient and she is is in agreement.  CODE STATUS: FULL  TOTAL TIME TAKING CARE OF THIS PATIENT: 21 minutes.     POSSIBLE D/C needs approval for SNF, DEPENDING ON CLINICAL CONDITION.   Keevan Wolz M.D on 03/16/2018 at 11:40 AM  Between 7am to 6pm - Pager - [phone] After 6pm go to [email or web address] - Social research officer, governmentpassword EPAS [facility]  [facility] [place]  Office  [phone]  CC: Primary care physician; [facility]  Note: This dictation was prepared with Nurse, children'sDragon dictation along with smaller phrase technology. Any transcriptional errors that result from this process are unintentional.

## 2018-03-17 NOTE — Clinical Social Work Note (Addendum)
CSW was informed that insurance company is denying patient, CSW was informed that a peer to peer can be completed with the insurance company.  CSW contacted attending physician, to ask if she can do a peer to peer.  2:50pm  CSW spoke to patient and her daughter in law, they have decided to take patient home with home health.  CSW signing off, case manager aware.  Ervin KnackEric R. Narmeen Kerper, MSW, Theresia MajorsLCSWA [phone]  03/17/2018 12:57 PM

## 2018-03-17 NOTE — Care Management Note (Signed)
Case Management Note  Patient Details  Name: Connie Beck MRN: [id number] Date of Birth: 06/22/1923  Subjective/Objective:     Patient is discharging to home with home health.  List provided to daughter in law.  She would like RNCM to make a referral to [facility] for home health  RN, PT, OT, aide, SW.   [facility] has accepted and notified of discharge today.              Action/Plan:   Expected Discharge Date:  03/17/18               Expected Discharge Plan:  Home w Home Health Services  In-House Referral:     Discharge planning Services  CM Consult  Post Acute Care Choice:  Home Health Choice offered to:  Adult Children  DME Arranged:    DME Agency:     HH Arranged:  RN, PT, OT, Nurse's Aide, Social Work [employer] Agency:  [facility]  Status of Service:  Completed, signed off  If discussed at [employer] Meetings, dates discussed:    Additional Comments:  Sherren KernsJennifer L Zailyn Thoennes, RN 03/17/2018, 2:32 PM

## 2018-03-17 NOTE — Plan of Care (Signed)
  Problem: Elimination: Goal: Will not experience complications related to bowel motility Outcome: Progressing Goal: Will not experience complications related to urinary retention Outcome: Progressing   

## 2018-03-17 NOTE — Discharge Instructions (Signed)
Shortness of Breath, Adult Shortness of breath is when a person has trouble breathing enough air, or when a person feels like she or he is having trouble breathing in enough air. Shortness of breath could be a sign of medical problem. Follow these instructions at home: Pay attention to any changes in your symptoms. Take these actions to help with your condition:  Do not smoke. Smoking is a common cause of shortness of breath. If you smoke and you need help quitting, ask your health care provider.  Avoid things that can irritate your airways, such as: ? Mold. ? Dust. ? Air pollution. ? Chemical fumes. ? Things that can cause allergy symptoms (allergens), if you have allergies.  Keep your living space clean and free of mold and dust.  Rest as needed. Slowly return to your usual activities.  Take over-the-counter and prescription medicines, including oxygen and inhaled medicines, only as told by your health care provider.  Keep all follow-up visits as told by your health care provider. This is important.  Contact a health care provider if:  Your condition does not improve as soon as expected.  You have a hard time doing your normal activities, even after you rest.  You have new symptoms. Get help right away if:  Your shortness of breath gets worse.  You have shortness of breath when you are resting.  You feel light-headed or you faint.  You have a cough that is not controlled with medicines.  You cough up blood.  You have pain with breathing.  You have pain in your chest, arms, shoulders, or abdomen.  You have a fever.  You cannot walk up stairs or exercise the way that you normally do. This information is not intended to replace advice given to you by your health care provider. Make sure you discuss any questions you have with your health care provider. Document Released: 12/19/2000 Document Revised: 10/15/2015 Document Reviewed: 09/01/2015 Elsevier Interactive Patient  Education  2018 Elsevier Inc.  

## 2018-03-17 NOTE — Progress Notes (Signed)
Physical Therapy Treatment Patient Details Name: Connie Beck MRN: [id number] DOB: 06-Jun-1923 Today's Date: 03/17/2018    History of Present Illness Pt presented to ER secondary to SOB, dizzy episodes; admitted for management of orthostatic vs vasovagal syncope.    PT Comments    Pt presented with deficits in strength, transfers, mobility, gait, balance, and activity tolerance.  Orthostatic assessment: supine BP 153/65; sitting BP 104/63 without symptoms, HR 74 bpm.  Unable to obtain a standing BP secondary to pt's poor standing balance and tolerance.  Pt required extensive physical assistance with both bed mobility tasks and tranfers.  Upon standing pt presented with constant posterior instability incorporating only ankle strategy to try to correct.  Pt attempted no hip or stepping strategies to try to correct posterior instability and as a result required constant mod A to prevent a posterior fall.  Pt was unable to advance either LE safely and is at a very high risk for falls.  Prior to admission pt was able to amb limited household distances with a rollator and is currently presenting with a significant decline in functional mobility compared to her baseline.  Pt will benefit from PT services in a SNF setting upon discharge to safely address above deficits for decreased caregiver assistance, decreased risk of falls, decreased risk of further functional decline, and eventual return to PLOF.     Follow Up Recommendations  SNF     Equipment Recommendations       Recommendations for Other Services       Precautions / Restrictions Precautions Precautions: Fall Restrictions Weight Bearing Restrictions: No    Mobility  Bed Mobility Overal bed mobility: Needs Assistance Bed Mobility: Supine to Sit;Sit to Supine     Supine to sit: Mod assist Sit to supine: Mod assist   General bed mobility comments: Mod A for BLEs in/out of bed and for trunk positioning  Transfers Overall transfer  level: Needs assistance Equipment used: Rolling walker (2 wheeled) Transfers: Sit to/from Stand Sit to Stand: Mod assist         General transfer comment: Posterior instability in standing requiring constant mod A to prevent posterior LOB, pt unable to correct  Ambulation/Gait             General Gait Details: unsafe/unable   Stairs             Wheelchair Mobility    Modified Rankin (Stroke Patients Only)       Balance Overall balance assessment: Needs assistance Sitting-balance support: No upper extremity supported;Feet supported Sitting balance-Leahy Scale: Fair     Standing balance support: Bilateral upper extremity supported Standing balance-Leahy Scale: Poor                              Cognition Arousal/Alertness: Awake/alert Behavior During Therapy: WFL for tasks assessed/performed Overall Cognitive Status: Within Functional Limits for tasks assessed                                        Exercises Total Joint Exercises Ankle Circles/Pumps: AROM;Both;10 reps Quad Sets: Strengthening;Both;10 reps Gluteal Sets: Strengthening;Both;10 reps Short Arc Quad: AROM;Both;10 reps Long Arc Quad: AROM;Both;10 reps Knee Flexion: AROM;Both;10 reps Other Exercises Other Exercises: Orthostatic assessment: supine BP 153/65; sitting BP 104/63 without symptoms, HR 74 bpm; unable to tolerate standing to assess BP,    General Comments  Pertinent Vitals/Pain Pain Assessment: No/denies pain    Home Living                      Prior Function            PT Goals (current goals can now be found in the care plan section)      Frequency    Min 2X/week      PT Plan Current plan remains appropriate    Co-evaluation              AM-PAC PT "6 Clicks" Mobility   Outcome Measure  Help needed turning from your back to your side while in a flat bed without using bedrails?: A Lot Help needed moving from  lying on your back to sitting on the side of a flat bed without using bedrails?: A Lot Help needed moving to and from a bed to a chair (including a wheelchair)?: A Lot Help needed standing up from a chair using your arms (e.g., wheelchair or bedside chair)?: A Lot Help needed to walk in hospital room?: Total Help needed climbing 3-5 steps with a railing? : Total 6 Click Score: 10    End of Session Equipment Utilized During Treatment: Gait belt Activity Tolerance: Patient tolerated treatment well Patient left: in bed;with call bell/phone within reach;with family/visitor present;with nursing/sitter in room;Other (comment)(Nursing assisting pt with hygiene, will set bed alarm when complete) Nurse Communication: Mobility status PT Visit Diagnosis: Unsteadiness on feet (R26.81);Muscle weakness (generalized) (M62.81);Difficulty in walking, not elsewhere classified (R26.2)     Time: [id number] PT Time Calculation (min) (ACUTE ONLY): 24 min  Charges:  $Therapeutic Exercise: 8-22 mins $Therapeutic Activity: 8-22 mins                     D. Scott Ramey [place] PT, DPT 03/17/18, 2:15 PM

## 2018-03-17 NOTE — Discharge Summary (Signed)
[facility] [place] at [facility]   PATIENT NAME: Connie Beck    MR#:  [id number]  DATE OF BIRTH:  1923/08/31  DATE OF ADMISSION:  03/12/2018 ADMITTING PHYSICIAN: Adrian Saran, MD  DATE OF DISCHARGE: 03/17/2018  PRIMARY CARE PHYSICIAN: [facility]    ADMISSION DIAGNOSIS:  Cerebral infarction [facility]) [I63.9] CVA (cerebral infarction) [I63.9] Constipation [K59.00] Syncope, unspecified syncope type [R55]  DISCHARGE DIAGNOSIS:  Active Problems:   Syncope   SECONDARY DIAGNOSIS:   Past Medical History:  Diagnosis Date  . Dizzy spells   . Hypertension     HOSPITAL COURSE:   84 year old female who initially presented with shortness of breath to the emergency room also has syncope.  1. Syncope:Syncopal events is due to UTI with mild dehydration.  Carotid Doppler did not show evidence of hemodynamically significant stenosis. ECHO SHOWS Study Conclusions  - Left ventricle: The cavity size was normal. There was mild   concentric hypertrophy. Systolic function was normal. The   estimated ejection fraction was in the range of 55% to 60%. Wall   motion was normal; there were no regional wall motion   abnormalities. Doppler parameters are consistent with abnormal   left ventricular relaxation (grade 1 diastolic dysfunction). - Left atrium: The atrium was normal in size. - Right ventricle: Systolic function was normal. - Pulmonary arteries: Systolic pressure was within the normal   range.   2. Shortness of breath:This has resolved.  3. Chronic kidney disease stage III: Creatinine is at baseline  4. Constipation:resolved 5.  UTI: sHe was treated for her urinary tract infection.   DISCHARGE CONDITIONS AND DIET:  Stable Regular diet  CONSULTS OBTAINED:    DRUG ALLERGIES:   Allergies  Allergen Reactions  . Darvon [Propoxyphene]   . Penicillins     Has patient had a PCN reaction causing immediate rash,  facial/tongue/throat swelling, SOB or lightheadedness with hypotension: Unknown Has patient had a PCN reaction causing severe rash involving mucus membranes or skin necrosis: Unknown Has patient had a PCN reaction that required hospitalization: Unknown Has patient had a PCN reaction occurring within the last 10 years: No If all of the above answers are "NO", then may proceed with Cephalosporin use.  . Sulfa Antibiotics     DISCHARGE MEDICATIONS:   Allergies as of 03/17/2018      Reactions   Darvon [propoxyphene]    Penicillins    Has patient had a PCN reaction causing immediate rash, facial/tongue/throat swelling, SOB or lightheadedness with hypotension: Unknown Has patient had a PCN reaction causing severe rash involving mucus membranes or skin necrosis: Unknown Has patient had a PCN reaction that required hospitalization: Unknown Has patient had a PCN reaction occurring within the last 10 years: No If all of the above answers are "NO", then may proceed with Cephalosporin use.   Sulfa Antibiotics       Medication List    TAKE these medications   docusate sodium 100 MG capsule Commonly known as:  COLACE Take 100 mg by mouth daily.   escitalopram 5 MG tablet Commonly known as:  LEXAPRO Take 5 mg by mouth daily.   meclizine 25 MG tablet Commonly known as:  ANTIVERT Take 25 mg by mouth daily.         Today   CHIEF COMPLAINT:   No acute issues overnight   VITAL SIGNS:  Blood pressure (!) 147/63, pulse 64, temperature 98.7 F (37.1 C), temperature source Oral, resp. rate 18, height 4\' 9"  (  1.448 m), weight 63.5 kg, SpO2 94 %.   REVIEW OF SYSTEMS:  Review of Systems  Constitutional: Positive for malaise/fatigue. Negative for chills, fever and weight loss.  HENT: Negative.  Negative for ear discharge, ear pain, hearing loss, nosebleeds and sore throat.   Eyes: Negative.  Negative for blurred vision and pain.  Respiratory: Negative.  Negative for cough, hemoptysis,  shortness of breath and wheezing.   Cardiovascular: Negative.  Negative for chest pain, palpitations and leg swelling.  Gastrointestinal: Negative.  Negative for abdominal pain, blood in stool, diarrhea, nausea and vomiting.  Genitourinary: Negative.  Negative for dysuria.  Musculoskeletal: Negative.  Negative for back pain.  Skin: Negative.   Neurological: Negative for dizziness, tremors, speech change, focal weakness, seizures and headaches.  Endo/Heme/Allergies: Negative.  Does not bruise/bleed easily.  Psychiatric/Behavioral: Positive for memory loss. Negative for depression, hallucinations and suicidal ideas.     PHYSICAL EXAMINATION:  GENERAL:  84 y.o.-year-old patient lying in the bed with no acute distress.  NECK:  Supple, no jugular venous distention. No thyroid enlargement, no tenderness.  LUNGS: Normal breath sounds bilaterally, no wheezing, rales,rhonchi  No use of accessory muscles of respiration.  CARDIOVASCULAR: S1, S2 normal. No murmurs, rubs, or gallops.  ABDOMEN: Soft, non-tender, non-distended. Bowel sounds present. No organomegaly or mass.  EXTREMITIES: No pedal edema, cyanosis, or clubbing.  PSYCHIATRIC: The patient is alert and oriented xNAME PLACE NOT DATE.  SKIN: No obvious rash, lesion, or ulcer.   DATA REVIEW:   CBC Recent Labs  Lab 03/13/18 0018  WBC 8.9  HGB 12.6  HCT 39.4  PLT 250    Chemistries  Recent Labs  Lab 03/12/18 1039 03/13/18 0619  NA 139 142  K 3.9 4.8  CL 105 113*  CO2 25 19*  GLUCOSE 106* 78  BUN 27* SHORT SAMPLED  CREATININE 1.48* 1.36*  CALCIUM 9.0 8.9  AST 22  --   ALT 11  --   ALKPHOS 77  --   BILITOT 0.5  --     Cardiac Enzymes Recent Labs  Lab 03/13/18 0018 03/13/18 0619 03/13/18 1220  TROPONINI 0.03* 0.03* <0.03    Microbiology Results  @MICRORSLT48 @  RADIOLOGY:  No results found.    Allergies as of 03/17/2018      Reactions   Darvon [propoxyphene]    Penicillins    Has patient had a PCN reaction  causing immediate rash, facial/tongue/throat swelling, SOB or lightheadedness with hypotension: Unknown Has patient had a PCN reaction causing severe rash involving mucus membranes or skin necrosis: Unknown Has patient had a PCN reaction that required hospitalization: Unknown Has patient had a PCN reaction occurring within the last 10 years: No If all of the above answers are "NO", then may proceed with Cephalosporin use.   Sulfa Antibiotics       Medication List    TAKE these medications   docusate sodium 100 MG capsule Commonly known as:  COLACE Take 100 mg by mouth daily.   escitalopram 5 MG tablet Commonly known as:  LEXAPRO Take 5 mg by mouth daily.   meclizine 25 MG tablet Commonly known as:  ANTIVERT Take 25 mg by mouth daily.         Management plans discussed with the patient and SHE is in agreement. Stable for discharge   Patient should follow up with PCP  CODE STATUS:     Code Status Orders  (From admission, onward)         Start  Ordered   03/13/18 1607  Full code  Continuous     03/13/18 1607        Code Status History    Date Active Date Inactive Code Status Order ID Comments User Context   02/11/2018 2054 02/13/2018 1709 Full Code [id number]  Shaune Pollack, MD Inpatient      TOTAL TIME TAKING CARE OF THIS PATIENT: 38 minutes.    Note: This dictation was prepared with Dragon dictation along with smaller phrase technology. Any transcriptional errors that result from this process are unintentional.  Latayvia Mandujano M.D on 03/17/2018 at 10:36 AM  Between 7am to 6pm - Pager - [phone] After 6pm go to [email or web address] - Social research officer, government  [facility] [place] [facility]  Office  [phone]  CC: Primary care physician; [facility]

## 2018-03-31 ENCOUNTER — Ambulatory Visit: Payer: Medicare HMO | Admitting: Nurse Practitioner

## 2018-04-01 ENCOUNTER — Encounter: Payer: Self-pay | Admitting: Nurse Practitioner

## 2018-05-09 ENCOUNTER — Inpatient Hospital Stay
Admission: EM | Admit: 2018-05-09 | Discharge: 2018-05-14 | DRG: 690 | Disposition: A | Discharge status: Skilled Nursing Facility | Payer: Medicare HMO | Attending: Internal Medicine | Admitting: Internal Medicine

## 2018-05-09 ENCOUNTER — Emergency Department: Payer: Medicare HMO

## 2018-05-09 DIAGNOSIS — M4856XA Collapsed vertebra, not elsewhere classified, lumbar region, initial encounter for fracture: Secondary | ICD-10-CM | POA: Diagnosis present

## 2018-05-09 DIAGNOSIS — N390 Urinary tract infection, site not specified: Principal | ICD-10-CM | POA: Diagnosis present

## 2018-05-09 DIAGNOSIS — Z79899 Other long term (current) drug therapy: Secondary | ICD-10-CM

## 2018-05-09 DIAGNOSIS — F329 Major depressive disorder, single episode, unspecified: Secondary | ICD-10-CM | POA: Diagnosis present

## 2018-05-09 DIAGNOSIS — R4182 Altered mental status, unspecified: Secondary | ICD-10-CM | POA: Diagnosis present

## 2018-05-09 DIAGNOSIS — F0390 Unspecified dementia without behavioral disturbance: Secondary | ICD-10-CM | POA: Diagnosis present

## 2018-05-09 DIAGNOSIS — E876 Hypokalemia: Secondary | ICD-10-CM | POA: Diagnosis present

## 2018-05-09 DIAGNOSIS — Z882 Allergy status to sulfonamides status: Secondary | ICD-10-CM

## 2018-05-09 DIAGNOSIS — R509 Fever, unspecified: Secondary | ICD-10-CM

## 2018-05-09 DIAGNOSIS — E440 Moderate protein-calorie malnutrition: Secondary | ICD-10-CM | POA: Diagnosis present

## 2018-05-09 DIAGNOSIS — Z880 Allergy status to penicillin: Secondary | ICD-10-CM

## 2018-05-09 DIAGNOSIS — R1310 Dysphagia, unspecified: Secondary | ICD-10-CM | POA: Diagnosis present

## 2018-05-09 DIAGNOSIS — Z6824 Body mass index (BMI) 24.0-24.9, adult: Secondary | ICD-10-CM | POA: Diagnosis not present

## 2018-05-09 DIAGNOSIS — M545 Low back pain, unspecified: Secondary | ICD-10-CM

## 2018-05-09 DIAGNOSIS — Z885 Allergy status to narcotic agent status: Secondary | ICD-10-CM

## 2018-05-09 DIAGNOSIS — R42 Dizziness and giddiness: Secondary | ICD-10-CM | POA: Diagnosis present

## 2018-05-09 DIAGNOSIS — I10 Essential (primary) hypertension: Secondary | ICD-10-CM | POA: Diagnosis present

## 2018-05-09 DIAGNOSIS — Z87440 Personal history of urinary (tract) infections: Secondary | ICD-10-CM | POA: Diagnosis not present

## 2018-05-09 LAB — CBC
HEMATOCRIT: 37.9 % (ref 36.0–46.0)
Hemoglobin: 12.4 g/dL (ref 12.0–15.0)
MCH: 31.8 pg (ref 26.0–34.0)
MCHC: 32.7 g/dL (ref 30.0–36.0)
MCV: 97.2 fL (ref 80.0–100.0)
Platelets: 280 10*3/uL (ref 150–400)
RBC: 3.9 MIL/uL (ref 3.87–5.11)
RDW: 13.4 % (ref 11.5–15.5)
WBC: 11.3 10*3/uL — ABNORMAL HIGH (ref 4.0–10.5)
nRBC: 0 % (ref 0.0–0.2)

## 2018-05-09 LAB — COMPREHENSIVE METABOLIC PANEL
ALT: 9 U/L (ref 0–44)
AST: 22 U/L (ref 15–41)
Albumin: 3.5 g/dL (ref 3.5–5.0)
Alkaline Phosphatase: 72 U/L (ref 38–126)
Anion gap: 10 (ref 5–15)
BUN: 19 mg/dL (ref 8–23)
CO2: 24 mmol/L (ref 22–32)
Calcium: 9.2 mg/dL (ref 8.9–10.3)
Chloride: 107 mmol/L (ref 98–111)
Creatinine, Ser: 1.46 mg/dL — ABNORMAL HIGH (ref 0.44–1.00)
GFR calc Af Amer: 36 mL/min — ABNORMAL LOW (ref >60)
GFR calc non Af Amer: 31 mL/min — ABNORMAL LOW (ref >60)
Glucose, Bld: 105 mg/dL — ABNORMAL HIGH (ref 70–99)
Potassium: 3.6 mmol/L (ref 3.5–5.1)
Sodium: 141 mmol/L (ref 135–145)
Total Bilirubin: 0.9 mg/dL (ref 0.3–1.2)
Total Protein: 7.4 g/dL (ref 6.5–8.1)

## 2018-05-09 LAB — URINALYSIS, COMPLETE (UACMP) WITH MICROSCOPIC
Bacteria, UA: NONE SEEN
Bilirubin Urine: NEGATIVE
Glucose, UA: NEGATIVE mg/dL
Ketones, ur: NEGATIVE mg/dL
NITRITE: NEGATIVE
Protein, ur: 30 mg/dL — AB
Specific Gravity, Urine: 1.012 (ref 1.005–1.030)
pH: 6 (ref 5.0–8.0)

## 2018-05-09 LAB — TROPONIN I: Troponin I: <0.03 ng/mL (ref <0.03)

## 2018-05-09 MED ORDER — ONDANSETRON HCL 4 MG PO TABS: 4.0000 mg | ORAL_TABLET | Freq: Four times a day (QID) | ORAL | Status: DC | PRN

## 2018-05-09 MED ORDER — ENOXAPARIN SODIUM 40 MG/0.4ML ~~LOC~~ SOLN: 40.0000 mg | SUBCUTANEOUS | Status: DC

## 2018-05-09 MED ORDER — MECLIZINE HCL 25 MG PO TABS
25.0000 mg | ORAL_TABLET | Freq: Every day | ORAL | Status: DC
  Administered 2018-05-11 – 2018-05-14 (×4): 25 mg via ORAL
  Filled 2018-05-09 (×6): qty 1

## 2018-05-09 MED ORDER — MIDODRINE HCL 5 MG PO TABS
2.5000 mg | ORAL_TABLET | Freq: Three times a day (TID) | ORAL | Status: DC
  Administered 2018-05-11 (×3): 2.5 mg via ORAL
  Filled 2018-05-09 (×11): qty 0.5

## 2018-05-09 MED ORDER — ACETAMINOPHEN 325 MG PO TABS
650.0000 mg | ORAL_TABLET | Freq: Four times a day (QID) | ORAL | Status: DC | PRN
  Administered 2018-05-13: 650 mg via ORAL
  Filled 2018-05-09: qty 2

## 2018-05-09 MED ORDER — SODIUM CHLORIDE 0.9% FLUSH
3.0000 mL | Freq: Once | INTRAVENOUS | Status: AC
  Administered 2018-05-09: 3 mL via INTRAVENOUS

## 2018-05-09 MED ORDER — SODIUM CHLORIDE 0.9 % IV SOLN
1.0000 g | Freq: Once | INTRAVENOUS | Status: AC
  Administered 2018-05-09: 1 g via INTRAVENOUS
  Filled 2018-05-09: qty 10

## 2018-05-09 MED ORDER — ACETAMINOPHEN 650 MG RE SUPP
650.0000 mg | Freq: Four times a day (QID) | RECTAL | Status: DC | PRN
  Administered 2018-05-10: 20:00:00 650 mg via RECTAL
  Filled 2018-05-09: qty 1

## 2018-05-09 MED ORDER — SODIUM CHLORIDE 0.9 % IV SOLN
1.0000 g | INTRAVENOUS | Status: DC
  Administered 2018-05-10 – 2018-05-11 (×2): 1 g via INTRAVENOUS
  Filled 2018-05-09 (×2): qty 1
  Filled 2018-05-09: qty 10

## 2018-05-09 MED ORDER — ONDANSETRON HCL 4 MG/2ML IJ SOLN: 4.0000 mg | Freq: Four times a day (QID) | INTRAMUSCULAR | Status: DC | PRN

## 2018-05-09 MED ORDER — ESCITALOPRAM OXALATE 10 MG PO TABS
5.0000 mg | ORAL_TABLET | Freq: Every day | ORAL | Status: DC
  Administered 2018-05-11 – 2018-05-14 (×4): 5 mg via ORAL
  Filled 2018-05-09 (×6): qty 0.5

## 2018-05-09 MED ORDER — DOCUSATE SODIUM 100 MG PO CAPS
100.0000 mg | ORAL_CAPSULE | Freq: Every day | ORAL | Status: DC
  Administered 2018-05-11 – 2018-05-14 (×4): 100 mg via ORAL
  Filled 2018-05-09 (×5): qty 1

## 2018-05-09 MED ORDER — ENOXAPARIN SODIUM 30 MG/0.3ML ~~LOC~~ SOLN
30.0000 mg | SUBCUTANEOUS | Status: DC
  Administered 2018-05-09 – 2018-05-13 (×5): 30 mg via SUBCUTANEOUS
  Filled 2018-05-09 (×6): qty 0.3

## 2018-05-09 NOTE — ED Notes (Signed)
Pt's legal guardian updated. Gunnar Fusi [place] [phone]. Understands pt will need to stay at the hospital for a few days.

## 2018-05-09 NOTE — ED Triage Notes (Signed)
Pt brought in AMS from home. Pt's family stating she has been altered since 1100 yesterday. Pt was up all night. Family stating pt has hx of frequent UTI.

## 2018-05-09 NOTE — ED Notes (Signed)
Pt playing with bed alarm pad and fiddling with sheets.

## 2018-05-09 NOTE — ED Provider Notes (Signed)
[facility] Emergency Department Provider Note   ____________________________________________    I have reviewed the triage vital signs and the nursing notes.   HISTORY  Chief Complaint Altered Mental Status   History limited by altered mental status  HPI Connie Beck is a 85 y.o. female who presents with altered mental status.  Apparently the patient has been altered over the last 24 hours, there is some concern for urinary tract infection which is caused similar symptoms in the past.  No further history available at this time.  Past Medical History:  Diagnosis Date  . Dizzy spells   . Hypertension     Patient Active Problem List   Diagnosis Date Noted  . Altered mental status 05/09/2018  . Syncope 03/12/2018  . ARF (acute renal failure) (HCC) 02/11/2018    Past Surgical History:  Procedure Laterality Date  . FRACTURE SURGERY  2018   pelvis    Prior to Admission medications   Medication Sig Start Date End Date Taking? Authorizing Provider  escitalopram (LEXAPRO) 5 MG tablet Take 5 mg by mouth daily.   Yes [provider]  meclizine (ANTIVERT) 25 MG tablet Take 25 mg by mouth daily.   Yes [provider]  midodrine (PROAMATINE) 2.5 MG tablet Take 2.5 mg by mouth 3 (three) times daily with meals.   Yes [provider]  docusate sodium (COLACE) 100 MG capsule Take 100 mg by mouth daily.    [provider]     Allergies Darvon [propoxyphene]; Penicillins; and Sulfa antibiotics  Family History  Family history unknown: Yes    Social History Social History   Tobacco Use  . Smoking status: Never Smoker  . Smokeless tobacco: Never Used  Substance Use Topics  . Alcohol use: Never    Frequency: Never  . Drug use: Never    Level 5 caveat: Unable to obtain review of Systems due to altered mental status     ____________________________________________   PHYSICAL EXAM:  VITAL SIGNS: ED  Triage Vitals  Enc Vitals Group     BP 05/09/18 1428 (!) 150/79     Pulse --      Resp 05/09/18 1424 20     Temp 05/09/18 1424 (!) 97.5 F (36.4 C)     Temp Source 05/09/18 1424 Axillary     SpO2 05/09/18 1428 93 %     Weight --      Height --      Head Circumference --      Peak Flow --      Pain Score --      Pain Loc --      Pain Edu? --      Excl. in GC? --     Constitutional: Alert but disoriented.  "Get away from me " Eyes: Conjunctivae are normal.  Head: Atraumatic. Nose: No congestion/rhinnorhea. Mouth/Throat: Mucous membranes are moist.    Cardiovascular: Normal rate, regular rhythm. Grossly normal heart sounds.  Good peripheral circulation. Respiratory: Normal respiratory effort.  No retractions. Lungs CTAB. Gastrointestinal: Soft and nontender. No distention.    Musculoskeletal:  Warm and well perfused Neurologic:   No gross focal neurologic deficits are appreciated.  Skin:  Skin is warm, dry and intact. No rash noted.   ____________________________________________   LABS (all labs ordered are listed, but only abnormal results are displayed)  Labs Reviewed  COMPREHENSIVE METABOLIC PANEL - Abnormal; Notable for the following components:      Result Value  Glucose, Bld 105 (*)    Creatinine, Ser 1.46 (*)    GFR calc non Af Amer 31 (*)    GFR calc Af Amer 36 (*)    All other components within normal limits  CBC - Abnormal; Notable for the following components:   WBC 11.3 (*)    All other components within normal limits  URINALYSIS, COMPLETE (UACMP) WITH MICROSCOPIC - Abnormal; Notable for the following components:   Color, Urine YELLOW (*)    APPearance CLOUDY (*)    Hgb urine dipstick SMALL (*)    Protein, ur 30 (*)    Leukocytes, UA LARGE (*)    WBC, UA >50 (*)    All other components within normal limits  URINE CULTURE  TROPONIN I  CBG MONITORING, ED   ____________________________________________  EKG  ED ECG REPORT I, Jene Every, the  attending physician, personally viewed and interpreted this ECG.  Date: 05/09/2018  Rhythm: normal sinus rhythm QRS Axis: normal Intervals: normal ST/T Wave abnormalities: normal Narrative Interpretation: no evidence of acute ischemia  ____________________________________________  RADIOLOGY  None ____________________________________________   PROCEDURES  Procedure(s) performed: No  Procedures   Critical Care performed: No ____________________________________________   INITIAL IMPRESSION / ASSESSMENT AND PLAN / ED COURSE  Pertinent labs & imaging results that were available during my care of the patient were reviewed by me and considered in my medical decision making (see chart for details).  Patient presents with reports of altered mental status.  Overall she is well-appearing although certainly altered.  Strength appears normal throughout.  Besides confusion normal neuro exam.  We will check labs, x-ray, urinalysis  Urinalysis consistent with UTI, patient is quite altered, treating with IV Rocephin will admit to the hospitalist service    ____________________________________________   FINAL CLINICAL IMPRESSION(S) / ED DIAGNOSES  Final diagnoses:  Altered mental status, unspecified altered mental status type  Lower urinary tract infectious disease        Note:  This document was prepared using Dragon voice recognition software and may include unintentional dictation errors.   Jene Every, MD 05/09/18 1739

## 2018-05-09 NOTE — ED Notes (Addendum)
Will call floor RN again in another .

## 2018-05-09 NOTE — ED Notes (Signed)
Pt repositioned in bed. Keeps purposefully scooting to end of bed. Knees raised again, bed tilted back. Remains on bed alarm.

## 2018-05-09 NOTE — ED Notes (Signed)
Pt in trendelenburg position so she won't craw out of bed. Pt singing to herself. Door open.

## 2018-05-09 NOTE — ED Notes (Signed)
EKG to EDP Kinner.  

## 2018-05-09 NOTE — Progress Notes (Signed)
PHARMACIST - PHYSICIAN COMMUNICATION  CONCERNING:  Enoxaparin (Lovenox) for DVT Prophylaxis    RECOMMENDATION: Patient was prescribed enoxaprin 40mg  q24 hours for VTE prophylaxis.   Using current Scr 1.51, age 85, and last known weight (140lb) - CrCl ~ 24   Patient is candidate for enoxaparin 30mg  every 24 hours based on CrCl <70ml/min  DESCRIPTION: Pharmacy has adjusted enoxaparin dose per [facility] policy.  Patient is now receiving enoxaparin 30mg  every 24 hours.  Albina Billet, PharmD, BCPS Clinical Pharmacist 05/09/2018 8:08 PM

## 2018-05-09 NOTE — ED Notes (Signed)
Pt alert but disoriented x4.

## 2018-05-09 NOTE — Progress Notes (Deleted)
[facility] [place] [facility]    PATIENT NAME: Connie Beck    MR#:  [id number]  DATE OF BIRTH:  02/01/1924  DATE OF ADMISSION:  05/09/2018  PRIMARY CARE PHYSICIAN: [facility]   REQUESTING/REFERRING PHYSICIAN: Dr. Jene Every  CHIEF COMPLAINT:   Chief Complaint  Patient presents with  . Altered Mental Status    HISTORY OF PRESENT ILLNESS:  Connie Beck  is a 85 y.o. female with a known history of hypertension, vertigo previous history of UTI who presents to the hospital from home due to altered mental status.  Patient herself is somewhat confused and encephalopathic therefore most of the history obtained from the chart and from the ER physician.  Patient apparently is usually alert and somewhat oriented and today the home health nurse and physical therapist noticed that she was more confused than normal.  She was able to tolerate her physical therapy but remains somewhat altered.  There were a bit concerned and therefore sent the patient to the ER for further evaluation.  In the ER on urinalysis was noted to be positive for urinary tract infection.  Hospitalist services were contacted for admission.  PAST MEDICAL HISTORY:   Past Medical History:  Diagnosis Date  . Dizzy spells   . Hypertension     PAST SURGICAL HISTORY:   Past Surgical History:  Procedure Laterality Date  . FRACTURE SURGERY  2018   pelvis    SOCIAL HISTORY:   Social History   Tobacco Use  . Smoking status: Never Smoker  . Smokeless tobacco: Never Used  Substance Use Topics  . Alcohol use: Never    Frequency: Never    FAMILY HISTORY:   Family History  Family history unknown: Yes    DRUG ALLERGIES:   Allergies  Allergen Reactions  . Darvon [Propoxyphene]   . Penicillins     Has patient had a PCN reaction causing immediate rash, facial/tongue/throat swelling, SOB or lightheadedness with hypotension: Unknown Has patient had a PCN reaction  causing severe rash involving mucus membranes or skin necrosis: Unknown Has patient had a PCN reaction that required hospitalization: Unknown Has patient had a PCN reaction occurring within the last 10 years: No If all of the above answers are "NO", then may proceed with Cephalosporin use.  . Sulfa Antibiotics     REVIEW OF SYSTEMS:   Review of Systems  Unable to perform ROS: Mental acuity    MEDICATIONS AT HOME:   Prior to Admission medications   Medication Sig Start Date End Date Taking? Authorizing Provider  escitalopram (LEXAPRO) 5 MG tablet Take 5 mg by mouth daily.   Yes [provider]  meclizine (ANTIVERT) 25 MG tablet Take 25 mg by mouth daily.   Yes [provider]  midodrine (PROAMATINE) 2.5 MG tablet Take 2.5 mg by mouth 3 (three) times daily with meals.   Yes [provider]  docusate sodium (COLACE) 100 MG capsule Take 100 mg by mouth daily.    [provider]      VITAL SIGNS:  Blood pressure (!) 185/88, pulse 83, temperature (!) 97.5 F (36.4 C), temperature source Axillary, resp. rate 16, SpO2 93 %.  PHYSICAL EXAMINATION:  Physical Exam  GENERAL:  85 y.o.-year-old patient lying in the bed confused/encephalopathic EYES: Pupils equal, round, reactive to light and accommodation. No scleral icterus. Extraocular muscles intact.  HEENT: Head atraumatic, normocephalic. Oropharynx and nasopharynx clear. No oropharyngeal erythema, moist oral mucosa  NECK:  Supple,  no jugular venous distention. No thyroid enlargement, no tenderness.  LUNGS: Normal breath sounds bilaterally, no wheezing, rales, rhonchi. No use of accessory muscles of respiration.  CARDIOVASCULAR: S1, S2 RRR. No murmurs, rubs, gallops, clicks.  ABDOMEN: Soft, nontender, nondistended. Bowel sounds present. No organomegaly or mass.  EXTREMITIES: No pedal edema, cyanosis, or clubbing. + 2 pedal & radial pulses b/l.   NEUROLOGIC: Cranial nerves II through XII are intact. No  focal Motor or sensory deficits appreciated b/l. Globally weak PSYCHIATRIC: The patient is alert and oriented x 1. Confused/Encephalopathic.  SKIN: No obvious rash, lesion, or ulcer.   LABORATORY PANEL:   CBC Recent Labs  Lab 05/09/18 1431  WBC 11.3*  HGB 12.4  HCT 37.9  PLT 280   ------------------------------------------------------------------------------------------------------------------  Chemistries  Recent Labs  Lab 05/09/18 1431  NA 141  K 3.6  CL 107  CO2 24  GLUCOSE 105*  BUN 19  CREATININE 1.46*  CALCIUM 9.2  AST 22  ALT 9  ALKPHOS 72  BILITOT 0.9   ------------------------------------------------------------------------------------------------------------------  Cardiac Enzymes Recent Labs  Lab 05/09/18 1432  TROPONINI <0.03   ------------------------------------------------------------------------------------------------------------------  RADIOLOGY:  Dg Chest Portable 1 View  Result Date: 05/09/2018 CLINICAL DATA:  85-year-old female with a history of altered mental status EXAM: PORTABLE CHEST 1 VIEW COMPARISON:  None. FINDINGS: Cardiomediastinal silhouette unchanged in size and contour. No pneumothorax. Opacity at the left lung base with blunting of left costophrenic angle. Partial obscuration of the left hemidiaphragm and left heart border. Similar appearance to the comparison plain film. No new confluent airspace disease. Chronic interstitial opacities. No displaced fracture IMPRESSION: Persistent left-sided pleural effusion with associated atelectasis/consolidation. Electronically Signed   By: Jaime  Wagner D.O.   On: 05/09/2018 15:25     IMPRESSION AND PLAN:   85-year-old female with past medical history of depression, vertigo who presents to the hospital due to altered mental status/confusion.  1.  Altered mental status- suspected to be metabolic encephalopathy secondary to UTI. -We will treat the patient with IV antibiotics for the UTI and   follow mental status.  2.  Urinary tract infection-based off a urinalysis on admission.  Patient has a previous history of E. coli UTI which is fairly pansensitive. -Continue IV ceftriaxone, follow urine cultures.    3.  Depression-continue Lexapro  4.  History of vertigo-continue meclizine, midodrine.  All the records are reviewed and case discussed with ED provider. Management plans discussed with the patient, family and they are in agreement.  CODE STATUS: Full code  TOTAL TIME TAKING CARE OF THIS PATIENT: 40 minutes.    , J M.D on 05/09/2018 at 5:24 PM  Between 7am to 6pm - Pager - [phone]  After 6pm go to [email or web address] - password [email or web address]  [facility]  Office  [phone]  CC: Primary care physician; [facility]     

## 2018-05-09 NOTE — H&P (Signed)
[facility] [place] [facility]    PATIENT NAME: Connie Beck    MR#:  [id number]  DATE OF BIRTH:  02/01/1924  DATE OF ADMISSION:  05/09/2018  PRIMARY CARE PHYSICIAN: [facility]   REQUESTING/REFERRING PHYSICIAN: Dr. Jene Every  CHIEF COMPLAINT:   Chief Complaint  Patient presents with  . Altered Mental Status    HISTORY OF PRESENT ILLNESS:  Connie Beck  is a 85 y.o. female with a known history of hypertension, vertigo previous history of UTI who presents to the hospital from home due to altered mental status.  Patient herself is somewhat confused and encephalopathic therefore most of the history obtained from the chart and from the ER physician.  Patient apparently is usually alert and somewhat oriented and today the home health nurse and physical therapist noticed that she was more confused than normal.  She was able to tolerate her physical therapy but remains somewhat altered.  There were a bit concerned and therefore sent the patient to the ER for further evaluation.  In the ER on urinalysis was noted to be positive for urinary tract infection.  Hospitalist services were contacted for admission.  PAST MEDICAL HISTORY:   Past Medical History:  Diagnosis Date  . Dizzy spells   . Hypertension     PAST SURGICAL HISTORY:   Past Surgical History:  Procedure Laterality Date  . FRACTURE SURGERY  2018   pelvis    SOCIAL HISTORY:   Social History   Tobacco Use  . Smoking status: Never Smoker  . Smokeless tobacco: Never Used  Substance Use Topics  . Alcohol use: Never    Frequency: Never    FAMILY HISTORY:   Family History  Family history unknown: Yes    DRUG ALLERGIES:   Allergies  Allergen Reactions  . Darvon [Propoxyphene]   . Penicillins     Has patient had a PCN reaction causing immediate rash, facial/tongue/throat swelling, SOB or lightheadedness with hypotension: Unknown Has patient had a PCN reaction  causing severe rash involving mucus membranes or skin necrosis: Unknown Has patient had a PCN reaction that required hospitalization: Unknown Has patient had a PCN reaction occurring within the last 10 years: No If all of the above answers are "NO", then may proceed with Cephalosporin use.  . Sulfa Antibiotics     REVIEW OF SYSTEMS:   Review of Systems  Unable to perform ROS: Mental acuity    MEDICATIONS AT HOME:   Prior to Admission medications   Medication Sig Start Date End Date Taking? Authorizing Provider  escitalopram (LEXAPRO) 5 MG tablet Take 5 mg by mouth daily.   Yes [provider]  meclizine (ANTIVERT) 25 MG tablet Take 25 mg by mouth daily.   Yes [provider]  midodrine (PROAMATINE) 2.5 MG tablet Take 2.5 mg by mouth 3 (three) times daily with meals.   Yes [provider]  docusate sodium (COLACE) 100 MG capsule Take 100 mg by mouth daily.    [provider]      VITAL SIGNS:  Blood pressure (!) 185/88, pulse 83, temperature (!) 97.5 F (36.4 C), temperature source Axillary, resp. rate 16, SpO2 93 %.  PHYSICAL EXAMINATION:  Physical Exam  GENERAL:  85 y.o.-year-old patient lying in the bed confused/encephalopathic EYES: Pupils equal, round, reactive to light and accommodation. No scleral icterus. Extraocular muscles intact.  HEENT: Head atraumatic, normocephalic. Oropharynx and nasopharynx clear. No oropharyngeal erythema, moist oral mucosa  NECK:  Supple,  no jugular venous distention. No thyroid enlargement, no tenderness.  LUNGS: Normal breath sounds bilaterally, no wheezing, rales, rhonchi. No use of accessory muscles of respiration.  CARDIOVASCULAR: S1, S2 RRR. No murmurs, rubs, gallops, clicks.  ABDOMEN: Soft, nontender, nondistended. Bowel sounds present. No organomegaly or mass.  EXTREMITIES: No pedal edema, cyanosis, or clubbing. + 2 pedal & radial pulses b/l.   NEUROLOGIC: Cranial nerves II through XII are intact. No  focal Motor or sensory deficits appreciated b/l. Globally weak PSYCHIATRIC: The patient is alert and oriented x 1. Confused/Encephalopathic.  SKIN: No obvious rash, lesion, or ulcer.   LABORATORY PANEL:   CBC Recent Labs  Lab 05/09/18 1431  WBC 11.3*  HGB 12.4  HCT 37.9  PLT 280   ------------------------------------------------------------------------------------------------------------------  Chemistries  Recent Labs  Lab 05/09/18 1431  NA 141  K 3.6  CL 107  CO2 24  GLUCOSE 105*  BUN 19  CREATININE 1.46*  CALCIUM 9.2  AST 22  ALT 9  ALKPHOS 72  BILITOT 0.9   ------------------------------------------------------------------------------------------------------------------  Cardiac Enzymes Recent Labs  Lab 05/09/18 1432  TROPONINI <0.03   ------------------------------------------------------------------------------------------------------------------  RADIOLOGY:  Dg Chest Portable 1 View  Result Date: 05/09/2018 CLINICAL DATA:  85 year old female with a history of altered mental status EXAM: PORTABLE CHEST 1 VIEW COMPARISON:  None. FINDINGS: Cardiomediastinal silhouette unchanged in size and contour. No pneumothorax. Opacity at the left lung base with blunting of left costophrenic angle. Partial obscuration of the left hemidiaphragm and left heart border. Similar appearance to the comparison plain film. No new confluent airspace disease. Chronic interstitial opacities. No displaced fracture IMPRESSION: Persistent left-sided pleural effusion with associated atelectasis/consolidation. Electronically Signed   By: Gilmer MorJaime  Wagner D.O.   On: 05/09/2018 15:25     IMPRESSION AND PLAN:   85 year old female with past medical history of depression, vertigo who presents to the hospital due to altered mental status/confusion.  1.  Altered mental status- suspected to be metabolic encephalopathy secondary to UTI. -We will treat the patient with IV antibiotics for the UTI and   follow mental status.  2.  Urinary tract infection-based off a urinalysis on admission.  Patient has a previous history of E. coli UTI which is fairly pansensitive. -Continue IV ceftriaxone, follow urine cultures.    3.  Depression-continue Lexapro  4.  History of vertigo-continue meclizine, midodrine.  All the records are reviewed and case discussed with ED provider. Management plans discussed with the patient, family and they are in agreement.  CODE STATUS: Full code  TOTAL TIME TAKING CARE OF THIS PATIENT: 40 minutes.    Houston SirenSAINANI,VIVEK J M.D on 05/09/2018 at 5:24 PM  Between 7am to 6pm - Pager - [phone]  After 6pm go to [email or web address] - password [email or web address] [facility]  [facility]  Office  [phone]  CC: Primary care physician; [facility]

## 2018-05-10 ENCOUNTER — Inpatient Hospital Stay: Payer: Medicare HMO

## 2018-05-10 ENCOUNTER — Other Ambulatory Visit: Payer: Self-pay

## 2018-05-10 LAB — CBC
HCT: 34.2 % — ABNORMAL LOW (ref 36.0–46.0)
Hemoglobin: 11.3 g/dL — ABNORMAL LOW (ref 12.0–15.0)
MCH: 32.2 pg (ref 26.0–34.0)
MCHC: 33 g/dL (ref 30.0–36.0)
MCV: 97.4 fL (ref 80.0–100.0)
Platelets: 240 10*3/uL (ref 150–400)
RBC: 3.51 MIL/uL — AB (ref 3.87–5.11)
RDW: 13.4 % (ref 11.5–15.5)
WBC: 10.8 10*3/uL — ABNORMAL HIGH (ref 4.0–10.5)
nRBC: 0 % (ref 0.0–0.2)

## 2018-05-10 LAB — BASIC METABOLIC PANEL WITH GFR
Anion gap: 7 (ref 5–15)
BUN: 20 mg/dL (ref 8–23)
CO2: 26 mmol/L (ref 22–32)
Calcium: 9.1 mg/dL (ref 8.9–10.3)
Chloride: 109 mmol/L (ref 98–111)
Creatinine, Ser: 1.43 mg/dL — ABNORMAL HIGH (ref 0.44–1.00)
GFR calc Af Amer: 37 mL/min — ABNORMAL LOW
GFR calc non Af Amer: 32 mL/min — ABNORMAL LOW
Glucose, Bld: 109 mg/dL — ABNORMAL HIGH (ref 70–99)
Potassium: 3.9 mmol/L (ref 3.5–5.1)
Sodium: 142 mmol/L (ref 135–145)

## 2018-05-10 MED ORDER — HALOPERIDOL LACTATE 5 MG/ML IJ SOLN
2.5000 mg | Freq: Four times a day (QID) | INTRAMUSCULAR | Status: DC | PRN
  Administered 2018-05-10: 2.5 mg via INTRAVENOUS
  Filled 2018-05-10: qty 1

## 2018-05-10 MED ORDER — SODIUM CHLORIDE 0.9 % IV SOLN
INTRAVENOUS | Status: DC
  Administered 2018-05-10 – 2018-05-12 (×4): via INTRAVENOUS

## 2018-05-10 NOTE — Care Management Note (Signed)
Case Management Note  Patient Details  Name: Connie Beck MRN: [id number] Date of Birth: [birth year]  Subjective/Objective:   [facility] with diagnosis of UTI. Patients home health nurse recommended coming to the Emergency room after the patients mental status had worsened. IV abx treatment for UTI. Patient is open to home health via [facility]. Was denied SNF last admission. Patient lives with daughter in law who is also POA. Uses [employer] for DME. [facility]ses [facility] without issue.                    Action/Plan:   Expected Discharge Date:                  Expected Discharge Plan:     In-House Referral:     Discharge planning Services     Post Acute Care Choice:    Choice offered to:     DME Arranged:    DME Agency:     HH Arranged:    HH Agency:     Status of Service:     If discussed at [employer] Length of Stay Meetings, dates discussed:    Additional Comments:  Virgel ManifoldJosh A Germain Koopmann, RN 05/10/2018, 10:35 AM

## 2018-05-10 NOTE — Progress Notes (Signed)
[employer] - [place] at [facility]   PATIENT NAME: Elvis CoilFaith Denunzio    MR#:  [id number]  DATE OF BIRTH:  1923-11-14  SUBJECTIVE:   Patient sleepy but easily arousable today.  Does not answer questions.  REVIEW OF SYSTEMS:  ROS- unable to answer questions secondary to altered mental status  DRUG ALLERGIES:   Allergies  Allergen Reactions  . Darvon [Propoxyphene]   . Penicillins     Has patient had a PCN reaction causing immediate rash, facial/tongue/throat swelling, SOB or lightheadedness with hypotension: Unknown Has patient had a PCN reaction causing severe rash involving mucus membranes or skin necrosis: Unknown Has patient had a PCN reaction that required hospitalization: Unknown Has patient had a PCN reaction occurring within the last 10 years: No If all of the above answers are "NO", then may proceed with Cephalosporin use.  . Sulfa Antibiotics    VITALS:  Blood pressure (!) 148/87, pulse 86, temperature 99.5 F (37.5 C), temperature source Oral, resp. rate 20, SpO2 96 %. PHYSICAL EXAMINATION:  Physical Exam  GENERAL:  85 y.o.-year-old patient lying in the bed, sleepy but arousable EYES: Pupils equal, round, reactive to light and accommodation. No scleral icterus. Extraocular muscles intact.  HEENT: Head atraumatic, normocephalic. Oropharynx and nasopharynx clear. No oropharyngeal erythema, moist oral mucosa  NECK:  Supple, no jugular venous distention. No thyroid enlargement, no tenderness.  LUNGS: Normal breath sounds bilaterally, no wheezing, rales, rhonchi. No use of accessory muscles of respiration.  CARDIOVASCULAR: S1, S2, RRR. No murmurs, rubs, gallops, clicks.  ABDOMEN: Soft, nontender, nondistended. Bowel sounds present. No organomegaly or mass.  EXTREMITIES: No pedal edema, cyanosis, or clubbing. + 2 pedal & radial pulses b/l.   NEUROLOGIC: Focal to assess, no obvious focal deficits PSYCHIATRIC: Sleepy but arousable SKIN: No obvious rash,  lesion, or ulcer.  LABORATORY PANEL:  Female CBC Recent Labs  Lab 05/10/18 0524  WBC 10.8*  HGB 11.3*  HCT 34.2*  PLT 240   ------------------------------------------------------------------------------------------------------------------ Chemistries  Recent Labs  Lab 05/09/18 1431 05/10/18 0524  NA 141 142  K 3.6 3.9  CL 107 109  CO2 24 26  GLUCOSE 105* 109*  BUN 19 20  CREATININE 1.46* 1.43*  CALCIUM 9.2 9.1  AST 22  --   ALT 9  --   ALKPHOS 72  --   BILITOT 0.9  --    RADIOLOGY:  Dg Chest Portable 1 View  Result Date: 05/09/2018 CLINICAL DATA:  85 year old female with a history of altered mental status EXAM: PORTABLE CHEST 1 VIEW COMPARISON:  None. FINDINGS: Cardiomediastinal silhouette unchanged in size and contour. No pneumothorax. Opacity at the left lung base with blunting of left costophrenic angle. Partial obscuration of the left hemidiaphragm and left heart border. Similar appearance to the comparison plain film. No new confluent airspace disease. Chronic interstitial opacities. No displaced fracture IMPRESSION: Persistent left-sided pleural effusion with associated atelectasis/consolidation. Electronically Signed   By: Gilmer MorJaime  Wagner D.O.   On: 05/09/2018 15:25   ASSESSMENT AND PLAN:   1.  Altered mental status- suspected to be metabolic encephalopathy secondary to UTI.  Remains altered today. -Continue treatment for UTI -Monitor mental status closely -Check CT head -Check TSH, RPR, HIV, vitamin B12, folate, and ammonia to rule out other causes -Start fluids, as patient has not taken anything by mouth  2.  Urinary tract infection- UA consistent with infection. Leukocytosis improving. -Continue IV ceftriaxone -Urine culture pending   3.  Depression-continue Lexapro  4.  History of  vertigo-continue meclizine, midodrine.  All the records are reviewed and case discussed with Care Management/Social Worker. Management plans discussed with the patient, family  and they are in agreement.  CODE STATUS: Full Code  TOTAL TIME TAKING CARE OF THIS PATIENT: 40 minutes.   More than 50% of the time was spent in counseling/coordination of care: YES  POSSIBLE D/C IN 2-3 DAYS, DEPENDING ON CLINICAL CONDITION.   Jinny Blossom Maleeah Crossman M.D on 05/10/2018 at 12:07 PM  Between 7am to 6pm - Pager [phone]  After 6pm go to [email or web address] - Scientist, research (life sciences) [facility]  Office  [phone]  CC: Primary care physician; Center, [facility]  Note: This dictation was prepared with Nurse, children's dictation along with smaller phrase technology. Any transcriptional errors that result from this process are unintentional.

## 2018-05-10 NOTE — Progress Notes (Addendum)
Patient agitated, yelling out. Received verbal order from Dr. Cherlynn Kaiser for haldol 2.5mg  prn every 6 hrs for agitated.

## 2018-05-11 ENCOUNTER — Inpatient Hospital Stay: Payer: Medicare HMO

## 2018-05-11 LAB — CBC
HCT: 35 % — ABNORMAL LOW (ref 36.0–46.0)
Hemoglobin: 11.3 g/dL — ABNORMAL LOW (ref 12.0–15.0)
MCH: 32.1 pg (ref 26.0–34.0)
MCHC: 32.3 g/dL (ref 30.0–36.0)
MCV: 99.4 fL (ref 80.0–100.0)
Platelets: 259 10*3/uL (ref 150–400)
RBC: 3.52 MIL/uL — ABNORMAL LOW (ref 3.87–5.11)
RDW: 13.7 % (ref 11.5–15.5)
WBC: 11.8 10*3/uL — AB (ref 4.0–10.5)
nRBC: 0 % (ref 0.0–0.2)

## 2018-05-11 LAB — GLUCOSE, CAPILLARY: Glucose-Capillary: 104 mg/dL — ABNORMAL HIGH (ref 70–99)

## 2018-05-11 LAB — TSH: TSH: 1.484 u[IU]/mL (ref 0.350–4.500)

## 2018-05-11 LAB — VITAMIN B12: Vitamin B-12: 1100 pg/mL — ABNORMAL HIGH (ref 180–914)

## 2018-05-11 LAB — BASIC METABOLIC PANEL
Anion gap: 10 (ref 5–15)
BUN: 20 mg/dL (ref 8–23)
CO2: 21 mmol/L — ABNORMAL LOW (ref 22–32)
Calcium: 8.4 mg/dL — ABNORMAL LOW (ref 8.9–10.3)
Chloride: 113 mmol/L — ABNORMAL HIGH (ref 98–111)
Creatinine, Ser: 1.24 mg/dL — ABNORMAL HIGH (ref 0.44–1.00)
GFR calc non Af Amer: 38 mL/min — ABNORMAL LOW (ref >60)
GFR, EST AFRICAN AMERICAN: 44 mL/min — AB (ref >60)
Glucose, Bld: 68 mg/dL — ABNORMAL LOW (ref 70–99)
Potassium: 3.1 mmol/L — ABNORMAL LOW (ref 3.5–5.1)
Sodium: 144 mmol/L (ref 135–145)

## 2018-05-11 LAB — FOLATE: Folate: 25 ng/mL (ref >5.9)

## 2018-05-11 LAB — PROCALCITONIN: Procalcitonin: <0.1 ng/mL

## 2018-05-11 LAB — URINE CULTURE: Culture: 10000 — AB

## 2018-05-11 LAB — MAGNESIUM: MAGNESIUM: 1.9 mg/dL (ref 1.7–2.4)

## 2018-05-11 LAB — AMMONIA: Ammonia: 17 umol/L (ref 9–35)

## 2018-05-11 MED ORDER — POTASSIUM CHLORIDE 10 MEQ/100ML IV SOLN
10.0000 meq | INTRAVENOUS | Status: AC
  Administered 2018-05-11 (×4): 10 meq via INTRAVENOUS
  Filled 2018-05-11 (×4): qty 100

## 2018-05-11 MED ORDER — MAGNESIUM SULFATE 2 GM/50ML IV SOLN
2.0000 g | Freq: Once | INTRAVENOUS | Status: AC
  Administered 2018-05-11: 14:00:00 2 g via INTRAVENOUS
  Filled 2018-05-11: qty 50

## 2018-05-11 NOTE — Evaluation (Signed)
Physical Therapy Evaluation Patient Details Name: Connie Beck MRN: [id number] DOB: Apr 22, 1923 Today's Date: 05/11/2018   History of Present Illness  Patient is 85 yo female admitted for UTI and AMS. PMH of HTN, vertigo, pelvis fx  Clinical Impression  Patient lethargic throughout session, but easily arousable, behavior WFLs, able to answer questions and follow simple one step commands consistently. PLOF gathered from chart review and prior admissions.   Patient demonstrated bed mobility with min-modAx1, able to sit EOB for several minutes, though posture slouched and pt appearing fatigued. Sit <> stand attempted x3 with mod-maxAx1, pt demonstrated posterior lean, unable to initiate steps, unable to maintain balance without physical assist.  Overall the patient demonstrated deficits (see "PT Problem List") that differ from patients PLOF (per chart) and would benefit from skilled PT intervention to maximize mobility, independence, and safety. Recommendation is STR due to current level of assistance needed for mobility.     Follow Up Recommendations SNF    Equipment Recommendations  Other (comment)(TBD)    Recommendations for Other Services       Precautions / Restrictions Precautions Precautions: Fall Restrictions Weight Bearing Restrictions: No      Mobility  Bed Mobility Overal bed mobility: Needs Assistance Bed Mobility: Supine to Sit;Sit to Supine;Rolling Rolling: Min assist   Supine to sit: Mod assist Sit to supine: Mod assist      Transfers Overall transfer level: Needs assistance Equipment used: Rolling walker (2 wheeled) Transfers: Sit to/from Stand Sit to Stand: Mod assist;Max assist         General transfer comment: attempted x3. pt presented with posterior lean , unable to maintain balance without at least modAx1.   Ambulation/Gait             General Gait Details: deferred   Stairs            Wheelchair Mobility    Modified Rankin (Stroke  Patients Only)       Balance Overall balance assessment: Needs assistance Sitting-balance support: Feet supported;Bilateral upper extremity supported Sitting balance-Leahy Scale: Poor       Standing balance-Leahy Scale: Poor                               Pertinent Vitals/Pain Pain Assessment: Faces Faces Pain Scale: No hurt    Home Living Family/patient expects to be discharged to:: Private residence Living Arrangements: Children Available Help at Discharge: Family;Available 24 hours/day Type of Home: House Home Access: Ramped entrance     Home Layout: One level Home Equipment: Walker - 2 wheels;Walker - 4 wheels;Bedside commode;Wheelchair - manual;Hospital bed      Prior Function Level of Independence: Needs assistance   Gait / Transfers Assistance Needed: Per chart, pt ambulates short household distances with rollator. WC for community (family pushes)  ADL's / Homemaking Assistance Needed: Assist with bathing, dressing.  Family does the driving, cooking, cleaning.    Comments: Pt poor historian. unable to state name     Hand Dominance        Extremity/Trunk Assessment   Upper Extremity Assessment Upper Extremity Assessment: Generalized weakness    Lower Extremity Assessment Lower Extremity Assessment: Generalized weakness       Communication   Communication: HOH  Cognition Arousal/Alertness: Lethargic Behavior During Therapy: WFL for tasks assessed/performed Overall Cognitive Status: No family/caregiver present to determine baseline cognitive functioning  General Comments      Exercises     Assessment/Plan    PT Assessment Patient needs continued PT services  PT Problem List Decreased strength;Decreased activity tolerance;Decreased balance;Decreased safety awareness;Decreased mobility;Decreased knowledge of precautions       PT Treatment Interventions DME  instruction;Therapeutic exercise;Gait training;Balance training;Stair training;Functional mobility training;Neuromuscular re-education;Cognitive remediation;Therapeutic activities;Patient/family education    PT Goals (Current goals can be found in the Care Plan section)  Acute Rehab PT Goals PT Goal Formulation: Patient unable to participate in goal setting    Frequency Min 2X/week   Barriers to discharge Decreased caregiver support      Co-evaluation               AM-PAC PT "6 Clicks" Mobility  Outcome Measure Help needed turning from your back to your side while in a flat bed without using bedrails?: A Lot Help needed moving from lying on your back to sitting on the side of a flat bed without using bedrails?: A Lot Help needed moving to and from a bed to a chair (including a wheelchair)?: Total Help needed standing up from a chair using your arms (e.g., wheelchair or bedside chair)?: A Lot Help needed to walk in hospital room?: Total Help needed climbing 3-5 steps with a railing? : Total 6 Click Score: 9    End of Session Equipment Utilized During Treatment: Gait belt Activity Tolerance: Patient limited by lethargy;Patient limited by fatigue Patient left: in bed;with bed alarm set;with call bell/phone within reach Nurse Communication: Mobility status PT Visit Diagnosis: Difficulty in walking, not elsewhere classified (R26.2);Muscle weakness (generalized) (M62.81);Other abnormalities of gait and mobility (R26.89)    Time: 1314-1340 PT Time Calculation (min) (ACUTE ONLY): 26 min   Charges:   PT Evaluation $PT Eval Low Complexity: 1 Low PT Treatments $Therapeutic Activity: 8-22 mins       Olga Coaster PT, DPT 4:26 PM,05/11/18 [phone]

## 2018-05-11 NOTE — Progress Notes (Signed)
[employer] - [place] at [facility]   PATIENT NAME: Connie Beck    MR#:  [id number]  DATE OF BIRTH:  09-10-1923  SUBJECTIVE:   Patient awake and alert today.  Per family at bedside, she had a great breakfast and has been acting like her normal self.  She had a fever to 100.5 yesterday afternoon.  REVIEW OF SYSTEMS:  Review of Systems  Constitutional: Positive for fever. Negative for chills.  HENT: Negative for congestion and sore throat.   Eyes: Negative for blurred vision and double vision.  Respiratory: Positive for cough. Negative for sputum production and shortness of breath.   Cardiovascular: Negative for chest pain and palpitations.  Gastrointestinal: Negative for nausea and vomiting.  Genitourinary: Negative for dysuria and urgency.  Musculoskeletal: Positive for back pain. Negative for neck pain.  Neurological: Negative for dizziness and headaches.  Psychiatric/Behavioral: Negative for depression. The patient is not nervous/anxious.     DRUG ALLERGIES:   Allergies  Allergen Reactions  . Darvon [Propoxyphene]   . Penicillins     Has patient had a PCN reaction causing immediate rash, facial/tongue/throat swelling, SOB or lightheadedness with hypotension: Unknown Has patient had a PCN reaction causing severe rash involving mucus membranes or skin necrosis: Unknown Has patient had a PCN reaction that required hospitalization: Unknown Has patient had a PCN reaction occurring within the last 10 years: No If all of the above answers are "NO", then may proceed with Cephalosporin use.  . Sulfa Antibiotics    VITALS:  Blood pressure (!) 184/74, pulse 73, temperature 98.2 F (36.8 C), temperature source Oral, resp. rate 20, height 4\' 9"  (1.448 m), weight 52.3 kg, SpO2 98 %. PHYSICAL EXAMINATION:  Physical Exam  GENERAL:  85 y.o. patient sitting up in bed, in no acute distress. EYES: Pupils equal, round, reactive to light and accommodation. No scleral  icterus. Extraocular muscles intact.  HEENT: Head atraumatic, normocephalic. Oropharynx and nasopharynx clear. No oropharyngeal erythema.  Mildly dry mucous membranes. NECK:  Supple, no jugular venous distention. No thyroid enlargement, no tenderness.  LUNGS: Normal breath sounds bilaterally, no wheezing, rales, rhonchi. No use of accessory muscles of respiration.  CARDIOVASCULAR: S1, S2, RRR. No murmurs, rubs, gallops, clicks.  ABDOMEN: Soft, nontender, nondistended. Bowel sounds present. No organomegaly or mass.  EXTREMITIES: No pedal edema, cyanosis, or clubbing. + 2 pedal & radial pulses b/l.   NEUROLOGIC: Cranial nerves II through XII grossly intact, no focal deficits, + global weakness, sensation intact throughout. PSYCHIATRIC: Awake and alert, oriented x2 SKIN: No obvious rash, lesion, or ulcer.  LABORATORY PANEL:  Female CBC Recent Labs  Lab 05/11/18 0448  WBC 11.8*  HGB 11.3*  HCT 35.0*  PLT 259   ------------------------------------------------------------------------------------------------------------------ Chemistries  Recent Labs  Lab 05/09/18 1431  05/11/18 0448  NA 141   < > 144  K 3.6   < > 3.1*  CL 107   < > 113*  CO2 24   < > 21*  GLUCOSE 105*   < > 68*  BUN 19   < > 20  CREATININE 1.46*   < > 1.24*  CALCIUM 9.2   < > 8.4*  MG  --   --  1.9  AST 22  --   --   ALT 9  --   --   ALKPHOS 72  --   --   BILITOT 0.9  --   --    < > = values in this interval not displayed.  RADIOLOGY:  Ct Head Wo Contrast  Result Date: 05/10/2018 CLINICAL DATA:  Change in mental status, question of cerebral ischemia. EXAM: CT HEAD WITHOUT CONTRAST TECHNIQUE: Contiguous axial images were obtained from the base of the skull through the vertex without intravenous contrast. COMPARISON:  03/12/2018. FINDINGS: Brain: No evidence for acute infarction, hemorrhage, mass lesion, hydrocephalus, or extra-axial fluid. Generalized atrophy. Moderately extensive hypoattenuation of white matter,  likely small vessel disease. Vascular: Calcification of the cavernous internal carotid arteries consistent with cerebrovascular atherosclerotic disease. No signs of intracranial large vessel occlusion. Skull: Calvarium intact. Sinuses/Orbits: Layering sphenoid sinus fluid, uncertain significance. Other paranasal sinuses are clear. BILATERAL ocular surgery. Other: No mastoid fluid. Compared with priors, similar intracranial appearance. Layering sphenoid sinus fluid was observed on that study. IMPRESSION: Atrophy and small vessel disease. No acute intracranial findings. Electronically Signed   By: Elsie Stain M.D.   On: 05/10/2018 12:53   ASSESSMENT AND PLAN:   1.  Altered mental status- suspected to be metabolic encephalopathy secondary to UTI.  Resolved today.  Per family at bedside, patient back to mental status baseline. -Continue treatment for UTI -CT head negative -Check TSH vitamin B12, folate, and ammonia were normal  2.  Urinary tract infection- UA consistent with infection. Leukocytosis improving. -Continue IV ceftriaxone -Urine culture pending  -Continue IV fluids for today  3.  Fever- patient had low-grade fever to 100.5 overnight.  WBC increased from 10.8 to 11.8. -Continue ceftriaxone for treatment of UTI -Check chest x-ray and procalcitonin given patient's cough  4.  Depression-continue Lexapro  5.  History of vertigo-continue meclizine, midodrine.  6.  Generalized weakness -PT/OT eval pending  7.  Hypokalemia- K 3.1 -Replete and recheck -Check mag  8.  Low back pain- seems to be a chronic issue for her -Check lumbar x-ray  All the records are reviewed and case discussed with Care Management/Social Worker. Management plans discussed with the patient, family and they are in agreement.  CODE STATUS: Full Code  TOTAL TIME TAKING CARE OF THIS PATIENT: 45 minutes.   More than 50% of the time was spent in counseling/coordination of care: YES  POSSIBLE D/C IN 1-2  DAYS, DEPENDING ON CLINICAL CONDITION.   Connie Beck M.D on 05/11/2018 at 11:06 AM  Between 7am to 6pm - Pager - [phone]  After 6pm go to [email or web address] - Scientist, research (life sciences) [facility]  Office  [phone]  CC: Primary care physician; [facility]  Note: This dictation was prepared with Nurse, children's dictation along with smaller phrase technology. Any transcriptional errors that result from this process are unintentional.

## 2018-05-11 NOTE — Progress Notes (Signed)
Family in to visit. K+ 3.1 with K+ 40 meq IV given as ordered. Continued IVF's and IV antiobiotic. Magnesium IV given as ordered. Pt more alert/pleasantly confused; cooperative with care- eating small amounts; drinks po's in small amounts. Recheck POCT glucose normal limits. PT consult done. Afebrile this shift.

## 2018-05-12 LAB — CBC
HCT: 34.1 % — ABNORMAL LOW (ref 36.0–46.0)
Hemoglobin: 11.2 g/dL — ABNORMAL LOW (ref 12.0–15.0)
MCH: 31.6 pg (ref 26.0–34.0)
MCHC: 32.8 g/dL (ref 30.0–36.0)
MCV: 96.3 fL (ref 80.0–100.0)
Platelets: 256 10*3/uL (ref 150–400)
RBC: 3.54 MIL/uL — AB (ref 3.87–5.11)
RDW: 13.7 % (ref 11.5–15.5)
WBC: 10 10*3/uL (ref 4.0–10.5)
nRBC: 0 % (ref 0.0–0.2)

## 2018-05-12 LAB — BASIC METABOLIC PANEL
ANION GAP: 6 (ref 5–15)
BUN: 17 mg/dL (ref 8–23)
CO2: 23 mmol/L (ref 22–32)
Calcium: 8.3 mg/dL — ABNORMAL LOW (ref 8.9–10.3)
Chloride: 113 mmol/L — ABNORMAL HIGH (ref 98–111)
Creatinine, Ser: 1.15 mg/dL — ABNORMAL HIGH (ref 0.44–1.00)
GFR calc Af Amer: 48 mL/min — ABNORMAL LOW (ref >60)
GFR calc non Af Amer: 41 mL/min — ABNORMAL LOW (ref >60)
Glucose, Bld: 88 mg/dL (ref 70–99)
Potassium: 3.1 mmol/L — ABNORMAL LOW (ref 3.5–5.1)
Sodium: 142 mmol/L (ref 135–145)

## 2018-05-12 LAB — MAGNESIUM: Magnesium: 2.2 mg/dL (ref 1.7–2.4)

## 2018-05-12 MED ORDER — SODIUM CHLORIDE 0.9 % IV SOLN
1.0000 g | INTRAVENOUS | Status: DC
  Filled 2018-05-12: qty 10

## 2018-05-12 MED ORDER — POTASSIUM CHLORIDE 10 MEQ/100ML IV SOLN
10.0000 meq | INTRAVENOUS | Status: AC
  Administered 2018-05-12 (×4): 10 meq via INTRAVENOUS
  Filled 2018-05-12 (×4): qty 100

## 2018-05-12 MED ORDER — ADULT MULTIVITAMIN W/MINERALS CH
1.0000 | ORAL_TABLET | Freq: Every day | ORAL | Status: DC
  Administered 2018-05-13 – 2018-05-14 (×2): 1 via ORAL
  Filled 2018-05-12 (×2): qty 1

## 2018-05-12 MED ORDER — POTASSIUM CHLORIDE CRYS ER 20 MEQ PO TBCR
40.0000 meq | EXTENDED_RELEASE_TABLET | Freq: Once | ORAL | Status: DC
  Filled 2018-05-12: qty 2

## 2018-05-12 MED ORDER — ENSURE ENLIVE PO LIQD
237.0000 mL | Freq: Two times a day (BID) | ORAL | Status: DC
  Administered 2018-05-13 – 2018-05-14 (×3): 237 mL via ORAL

## 2018-05-12 NOTE — [place] FL2 (Signed)
[place] MEDICAID FL2 LEVEL OF CARE SCREENING TOOL     IDENTIFICATION  Patient Name: Connie Beck Birthdate: Sep 14, 1923 Sex: female Admission Date (Current Location): 05/09/2018  [place] and [place] Number:  Chiropodist and Address:  [facility], [address], [place], [place] [address]      Provider Number: [id number]  Attending Physician Name and Address:  Campbell Stall, MD  Relative Name and Phone Number:       Current Level of Care: Hospital Recommended Level of Care: Skilled Nursing Facility Prior Approval Number:    Date Approved/Denied:   PASRR Number: [id number]  Discharge Plan: SNF    Current Diagnoses: Patient Active Problem List   Diagnosis Date Noted  . Altered mental status 05/09/2018  . Syncope 03/12/2018  . ARF (acute renal failure) (HCC) 02/11/2018    Orientation RESPIRATION BLADDER Height & Weight     Self, Place  Normal Incontinent Weight: 115 lb 4.8 oz (52.3 kg) Height:  4\' 9"  (144.8 cm)  BEHAVIORAL SYMPTOMS/MOOD NEUROLOGICAL BOWEL NUTRITION STATUS  (none) (none) Incontinent Diet(Regular )  AMBULATORY STATUS COMMUNICATION OF NEEDS Skin   Extensive Assist Verbally Normal                       Personal Care Assistance Level of Assistance  Bathing, Feeding, Dressing Bathing Assistance: Limited assistance Feeding assistance: Independent Dressing Assistance: Limited assistance     Functional Limitations Info  Sight, Hearing, Speech Sight Info: Adequate Hearing Info: Adequate Speech Info: Adequate    SPECIAL CARE FACTORS FREQUENCY  PT (By licensed PT), OT (By licensed OT)     PT Frequency: 5 OT Frequency: 5            Contractures Contractures Info: Not present    Additional Factors Info  Code Status, Allergies Code Status Info: Full Code  Allergies Info: Darvon (Propoxyphene), Penicillins, Sulfa Antibiotics           Current Medications (05/12/2018):  This is the current  hospital active medication list Current Facility-Administered Medications  Medication Dose Route Frequency Provider Last Rate Last Dose  . 0.9 %  sodium chloride infusion   Intravenous Continuous Mayo, Allyn Kenner, MD 75 mL/hr at 05/11/18 2129    . acetaminophen (TYLENOL) tablet 650 mg  650 mg Oral Q6H PRN Houston Siren, MD       Or  . acetaminophen (TYLENOL) suppository 650 mg  650 mg Rectal Q6H PRN Houston Siren, MD   650 mg at 05/10/18 1954  . cefTRIAXone (ROCEPHIN) 1 g in sodium chloride 0.9 % 100 mL IVPB  1 g Intravenous Q24H Mayo, Allyn Kenner, MD      . docusate sodium (COLACE) capsule 100 mg  100 mg Oral Daily Houston Siren, MD   100 mg at 05/11/18 0732  . enoxaparin (LOVENOX) injection 30 mg  30 mg Subcutaneous Q24H Albina Billet, RPH   30 mg at 05/11/18 2135  . escitalopram (LEXAPRO) tablet 5 mg  5 mg Oral Daily Houston Siren, MD   5 mg at 05/11/18 0929  . haloperidol lactate (HALDOL) injection 2.5 mg  2.5 mg Intravenous Q6H PRN Houston Siren, MD   2.5 mg at 05/10/18 0313  . meclizine (ANTIVERT) tablet 25 mg  25 mg Oral Daily Houston Siren, MD   25 mg at 05/11/18 0929  . midodrine (PROAMATINE) tablet 2.5 mg  2.5 mg Oral TID WC Houston Siren, MD  2.5 mg at 05/11/18 1725  . ondansetron (ZOFRAN) tablet 4 mg  4 mg Oral Q6H PRN Houston Siren, MD       Or  . ondansetron (ZOFRAN) injection 4 mg  4 mg Intravenous Q6H PRN Sainani, Rolly Pancake, MD      . potassium chloride 10 mEq in 100 mL IVPB  10 mEq Intravenous Q1 Hr x 4 Mayo, Allyn Kenner, MD 100 mL/hr at 05/12/18 0910 10 mEq at 05/12/18 1610     Discharge Medications: Please see discharge summary for a list of discharge medications.  Relevant Imaging Results:  Relevant Lab Results:   Additional Information SSN: [id number]  [name], [place]

## 2018-05-12 NOTE — Clinical Social Work Note (Signed)
Clinical Social Work Assessment  Patient Details  Name: Connie Beck MRN: [id number] Date of Birth: 1923/12/09  Date of referral:  05/12/18               Reason for consult:  Facility Placement                Permission sought to share information with:  Case Manager, Magazine features editor, Family Supports Permission granted to share information::  Yes, Verbal Permission Granted  Name::      SNF  Agency::   [place]   Relationship::     Contact Information:     Housing/Transportation Living arrangements for the past 2 months:  Single Family Home Source of Information:  Power of Attorney Patient Interpreter Needed:  None Criminal Activity/Legal Involvement Pertinent to Current Situation/Hospitalization:  No - Comment as needed Significant Relationships:  Other Family Members Lives with:  Relatives Do you feel safe going back to the place where you live?  Yes Need for family participation in patient care:  Yes (Comment)  Care giving concerns:  Patient lives with daughter in law in [place]    Social Worker assessment / plan:  CSW consulted for facility placement. CSW spoke with patient's POA Marijo File. POA states that patient lives with her and she moved her here from [place]. CSW explained PT recommendation of SNF. POA is in agreement with SNF placement. CSW explained bed search process and POA is in agreement with bed search. CSW will continue to follow and give bed offers once received.   Employment status:  Retired Database administrator PT Recommendations:  Skilled Nursing Facility Information / Referral to community resources:  Skilled Nursing Facility  Patient/Family's Response to care:  POA thanked CSW for assistance   Patient/Family's Understanding of and Emotional Response to Diagnosis, Current Treatment, and Prognosis:  POA states understanding of current treatment and plan   Emotional Assessment Appearance:  Appears  stated age Attitude/Demeanor/Rapport:    Affect (typically observed):  Pleasant, Quiet Orientation:  Oriented to Self, Oriented to Place Alcohol / Substance use:  Not Applicable Psych involvement (Current and /or in the community):  No (Comment)  Discharge Needs  Concerns to be addressed:  Discharge Planning Concerns Readmission within the last 30 days:  No Current discharge risk:  Dependent with Mobility, Cognitively Impaired Barriers to Discharge:  Continued Medical Work up   [employer], LCSWA 05/12/2018, 9:33 AM

## 2018-05-12 NOTE — Care Management Important Message (Signed)
Important Message  Patient Details  Name: Connie Beck MRN: [id number] Date of Birth: 11/25/1923   Medicare Important Message Given:  Yes    [name] A [name] 05/12/2018, 11:10 AM

## 2018-05-12 NOTE — Progress Notes (Signed)
Initial Nutrition Assessment  DOCUMENTATION CODES:   Non-severe (moderate) malnutrition in context of chronic illness  INTERVENTION:  Provide Ensure Enlive po BID, each supplement provides 350 kcal and 20 grams of protein.   Provide Magic cup TID with meals, each supplement provides 290 kcal and 9 grams of protein.  Provide daily MVI.  NUTRITION DIAGNOSIS:   Moderate Malnutrition related to chronic illness(dysphagia, advanced age) as evidenced by moderate fat depletion, mild muscle depletion, moderate muscle depletion.  GOAL:   Patient will meet greater than or equal to 90% of their needs  MONITOR:   PO intake, Supplement acceptance, Labs, Weight trends, I & O's  REASON FOR ASSESSMENT:   Malnutrition Screening Tool, Consult Assessment of nutrition requirement/status  ASSESSMENT:   85 year old female with PMHx of HTN admitted with AMS, UTI, dysphagia.   -Following SLP evaluation today diet was downgraded to dysphagia 1 with thin liquids (pt was previously on regular diet).  Met with patient at bedside. She was confused and not able to provide any history. No family members present at time of RD assessment. Yogurt and applesauce on the counter had some bites taken out of them. Her tray at bedside was untouched.   Patient is unsure of weight history. Per chart she was 64.7 kg on 02/11/2018, 63.5 kg on 03/12/2018, and is currently 52.3 kg (115.3 lbs). If that is correct, she has lost 12.4 kg (19.2% body weight) over the past 3 months, which is significant for time frame. Seems unlikely she would have lost that much weight in such a short time frame.  Meal Completion: 0-25% per chart  Medications reviewed and include: Colace 100 mg daily.  Labs reviewed: CBG 104, Potassium 3.1, Chloride 113, Creatinine 1.15.  NUTRITION - FOCUSED PHYSICAL EXAM:    Most Recent Value  Orbital Region  Moderate depletion  Upper Arm Region  Moderate depletion  Thoracic and Lumbar Region  Mild  depletion  Buccal Region  Moderate depletion  Temple Region  Moderate depletion  Clavicle Bone Region  Mild depletion  Clavicle and Acromion Bone Region  Mild depletion  Scapular Bone Region  Unable to assess  Dorsal Hand  Moderate depletion  Patellar Region  Moderate depletion  Anterior Thigh Region  Moderate depletion  Posterior Calf Region  Severe depletion  Edema (RD Assessment)  None  Hair  Reviewed [dry and brittle]  Eyes  Reviewed  Mouth  Reviewed  Skin  Reviewed  Nails  Reviewed     Diet Order:   Diet Order            DIET - DYS 1 Room service appropriate? Yes; Fluid consistency: Thin  Diet effective now             EDUCATION NEEDS:   Not appropriate for education at this time  Skin:  Skin Assessment: Reviewed RN Assessment  Last BM:  05/10/2018 - type 4  Height:   Ht Readings from Last 1 Encounters:  05/10/18 '4\' 9"'  (1.448 m)   Weight:   Wt Readings from Last 1 Encounters:  05/10/18 52.3 kg   Ideal Body Weight:  43.2 kg  BMI:  Body mass index is 24.95 kg/m.  Estimated Nutritional Needs:   Kcal:  8270-7867 (25-30 kcal/kg)  Protein:  60-70 (1.2-1.4 grams/kg)  Fluid:  1.3-1.5 L/day (1 mL/kcal)  Willey Blade, MS, RD, LDN Office: [phone] Pager: [phone] After Hours/Weekend Pager: [phone]

## 2018-05-12 NOTE — Progress Notes (Addendum)
[facility] [place] [facility]   PATIENT NAME: Connie Beck    MR#:  [id number]  DATE OF BIRTH:  March 21, 1924  SUBJECTIVE:   Patient states she feels fine this morning.  She says she ate a good breakfast.  She denies any fevers or chills.  Afebrile overnight.  REVIEW OF SYSTEMS:  Review of Systems  Constitutional: Negative for chills and fever.  HENT: Negative for congestion and sore throat.   Eyes: Negative for blurred vision and double vision.  Respiratory: Negative for cough, sputum production and shortness of breath.   Cardiovascular: Negative for chest pain and palpitations.  Gastrointestinal: Negative for nausea and vomiting.  Genitourinary: Negative for dysuria and urgency.  Musculoskeletal: Positive for back pain. Negative for neck pain.  Neurological: Negative for dizziness and headaches.  Psychiatric/Behavioral: Negative for depression. The patient is not nervous/anxious.     DRUG ALLERGIES:   Allergies  Allergen Reactions  . Darvon [Propoxyphene]   . Penicillins     Has patient had a PCN reaction causing immediate rash, facial/tongue/throat swelling, SOB or lightheadedness with hypotension: Unknown Has patient had a PCN reaction causing severe rash involving mucus membranes or skin necrosis: Unknown Has patient had a PCN reaction that required hospitalization: Unknown Has patient had a PCN reaction occurring within the last 10 years: No If all of the above answers are "NO", then may proceed with Cephalosporin use.  . Sulfa Antibiotics    VITALS:  Blood pressure 137/65, pulse 77, temperature 98.1 F (36.7 C), temperature source Oral, resp. rate 18, height 4\' 9"  (1.448 m), weight 52.3 kg, SpO2 97 %. PHYSICAL EXAMINATION:  Physical Exam  GENERAL:  85 y.o. patient sitting up in bed, in no acute distress. EYES: Pupils equal, round, reactive to light and accommodation. No scleral icterus. Extraocular muscles intact.  HEENT: Head atraumatic,  normocephalic. Oropharynx and nasopharynx clear. No oropharyngeal erythema.  Mildly dry mucous membranes. NECK:  Supple, no jugular venous distention. No thyroid enlargement, no tenderness.  LUNGS: Normal breath sounds bilaterally, no wheezing, rales, rhonchi. No use of accessory muscles of respiration.  CARDIOVASCULAR: S1, S2, RRR. No murmurs, rubs, gallops, clicks.  ABDOMEN: Soft, nontender, nondistended. Bowel sounds present. No organomegaly or mass.  EXTREMITIES: No pedal edema, cyanosis, or clubbing. + 2 pedal & radial pulses b/l.   NEUROLOGIC: Cranial nerves II through XII grossly intact, no focal deficits, + global weakness, sensation intact throughout. PSYCHIATRIC: Awake and alert, oriented x2 SKIN: No obvious rash, lesion, or ulcer.  LABORATORY PANEL:  Female CBC Recent Labs  Lab 05/12/18 0433  WBC 10.0  HGB 11.2*  HCT 34.1*  PLT 256   ------------------------------------------------------------------------------------------------------------------ Chemistries  Recent Labs  Lab 05/09/18 1431  05/12/18 0433  NA 141   < > 142  K 3.6   < > 3.1*  CL 107   < > 113*  CO2 24   < > 23  GLUCOSE 105*   < > 88  BUN 19   < > 17  CREATININE 1.46*   < > 1.15*  CALCIUM 9.2   < > 8.3*  MG  --    < > 2.2  AST 22  --   --   ALT 9  --   --   ALKPHOS 72  --   --   BILITOT 0.9  --   --    < > = values in this interval not displayed.   RADIOLOGY:  No results found. ASSESSMENT AND PLAN:  1. AMS- resolved. Initially felt to be secondary to UTI, but urine culture growing yeast. May just be a worsening of patient's underlying dementia. CT head negative. TSH, ammonia, B12, folate all unremarkable.  2.  Urinary tract infection- UA consistent with infection, but urine culture growing yeast. -Stop IV ceftriaxone today (received 3 day course) -Stop fluids  3.  Fever- resolved. PCT and CXR negative. Leukocytosis improved. No signs of skin breakdown on exam.  4.  Depression-continue  Lexapro  5.  History of vertigo-continue meclizine, midodrine.  6.  Generalized weakness -PT/OT recommending SNF  7.  Hypokalemia- K 3.1 again today. Magnesium normal. -Replete and recheck  8.  Chronic L2 compression fracture- seen on lumbar x-ray yesterday. -Supportive care  9. Dysphagia- family concerned about patient's swallowing -SLP eval today  Patient is medically stable for discharge. Awaiting placement at SNF.  All the records are reviewed and case discussed with Care Management/Social Worker. Management plans discussed with the patient, family and they are in agreement.  CODE STATUS: Full Code  TOTAL TIME TAKING CARE OF THIS PATIENT: 40 minutes.   More than 50% of the time was spent in counseling/coordination of care: YES  POSSIBLE D/C tomorrow, DEPENDING ON CLINICAL CONDITION.   Jinny BlossomKaty D  M.D on 05/12/2018 at 11:09 AM  Between 7am to 6pm - Pager - [phone]  After 6pm go to [email or web address] - Scientist, research (life sciences)password EPAS [facility]  [facility]  Office  [phone]  CC: Primary care physician; Center, [facility]  Note: This dictation was prepared with Nurse, children'sDragon dictation along with smaller phrase technology. Any transcriptional errors that result from this process are unintentional.

## 2018-05-12 NOTE — Evaluation (Signed)
Occupational Therapy Evaluation Patient Details Name: Elvis CoilFaith Dani MRN: [id number] DOB: 08-10-1923 Today's Date: 05/12/2018    History of Present Illness Patient is 85 yo female admitted for UTI and AMS. PMH of HTN, vertigo, pelvis fx   Clinical Impression   Pt seen for OT evaluation this date. Upon arrival to room, pt awake and alert seated in bed with family members (DIL/SIL) at bedside.  Prior to hospital admission, pt required assistance with most ADLs. Family endorses pt needed to take bed baths, and being unable ambulate without assistance within her home. Family members also endorse that the pt has a variety of home services in place, but her SIL indicated that she had difficulty participating in home-based therapy sessions. SIL stated that pt would "pass out" during sessions with exertion.  Pt lives in a one level home with a ramped entrance. Family provides care and a PCA visits 1-2x per week for ADL support. Currently pt demonstrates impairments in cognition, self-care, and functional mobility requiring mod-max assist for ADL and mobility.  Pt would benefit from skilled OT to address noted impairments and functional limitations (see below for any additional details) in order to maximize safety and independence while minimizing falls risk and caregiver burden.  Upon hospital discharge, recommend pt discharge to SNF.     Follow Up Recommendations  SNF;Supervision/Assistance - 24 hour    Equipment Recommendations  (TBD)    Recommendations for Other Services       Precautions / Restrictions Precautions Precautions: Fall Restrictions Weight Bearing Restrictions: No      Mobility Bed Mobility Overal bed mobility: Needs Assistance Bed Mobility: Supine to Sit;Sit to Supine;Rolling Rolling: Min assist   Supine to sit: Mod assist Sit to supine: Mod assist   General bed mobility comments: Per PT pt required mod a for sit<>sup. Min a for rolling. Pt unable to maintain balance  without support. On this date, pt req +2 assist for bed boost.   Transfers                 General transfer comment: Deferred. Per PT pt unable come to standing. Unable to maintain seated balance without at least mod A.     Balance Overall balance assessment: Needs assistance Sitting-balance support: Feet supported;Bilateral upper extremity supported Sitting balance-Leahy Scale: Poor                                     ADL either performed or assessed with clinical judgement   ADL Overall ADL's : Needs assistance/impaired                                       General ADL Comments: Pt requires assistance with ADLs at baseline, however family members presenta at bedside on this date indicate that Ms. Gerstner's level of assistance needed has increased since admission. She is grossly mod/max assist for all ADL tasks including self-care, grooming, feeding, and +2 for mobility.      Vision Baseline Vision/History: Wears glasses Wears Glasses: At all times Patient Visual Report: No change from baseline       Perception     Praxis      Pertinent Vitals/Pain Pain Assessment: No/denies pain Faces Pain Scale: No hurt     Hand Dominance Right   Extremity/Trunk Assessment Upper Extremity Assessment Upper Extremity Assessment:  Generalized weakness   Lower Extremity Assessment Lower Extremity Assessment: Generalized weakness       Communication Communication Communication: HOH   Cognition Arousal/Alertness: Lethargic Behavior During Therapy: WFL for tasks assessed/performed Overall Cognitive Status: Difficult to assess                                 General Comments: Family members state that pt cognitive status is at baseline, however pt unable to answer A&O questions. May be 2/2 hearing impairment.    General Comments       Exercises     Shoulder Instructions      Home Living Family/patient expects to be  discharged to:: Private residence Living Arrangements: Children Available Help at Discharge: Family;Available 24 hours/day Type of Home: House Home Access: Ramped entrance     Home Layout: One level     Bathroom Shower/Tub: Tub only   Firefighter: Standard     Home Equipment: Environmental consultant - 2 wheels;Walker - 4 wheels;Bedside commode;Wheelchair - manual;Hospital bed;Shower seat   Additional Comments: Pt family endorses that pt has a variety of home equipment including a BSC, Hospital bed, lift (type not indicated), and shower seat.      Prior Functioning/Environment Level of Independence: Needs assistance  Gait / Transfers Assistance Needed: Per family, pt not ambulating much. Completes ADLs at bed level.  ADL's / Homemaking Assistance Needed: Assist with bathing, dressing.  Family does the driving, cooking, cleaning.   Communication / Swallowing Assistance Needed: Pt HOH. Must sit to left side for her to hear.  Comments: Pt is poor historian, however family availble during session to verify PLOF.        OT Problem List: Decreased strength;Decreased range of motion;Decreased activity tolerance;Decreased coordination;Impaired balance (sitting and/or standing);Decreased safety awareness;Decreased knowledge of use of DME or AE;Impaired UE functional use;Decreased knowledge of precautions      OT Treatment/Interventions: Self-care/ADL training;Therapeutic exercise;Patient/family education;Therapeutic activities;DME and/or AE instruction    OT Goals(Current goals can be found in the care plan section) Acute Rehab OT Goals Patient Stated Goal: To feel better and go home. OT Goal Formulation: With patient/family Time For Goal Achievement: 05/26/18 Potential to Achieve Goals: Good ADL Goals Pt Will Perform Eating: with min assist;bed level;with adaptive utensils(With LRAE PRN for improved functional independence.) Pt Will Perform Grooming: with min assist;bed level;with adaptive  equipment(With LRAD PRN for improved functional independence.) Additional ADL Goal #1: With verbal and physical prompting PRN, pt will implement learned bed mobility strategies when engaging in ADLs at bed level in order to improve safety and decrease caregiver burden with bed level ADLs.  OT Frequency: Min 1X/week   Barriers to D/C: Decreased caregiver support  Family not available 24/7.        Co-evaluation              AM-PAC OT "6 Clicks" Daily Activity     Outcome Measure Help from another person eating meals?: A Lot Help from another person taking care of personal grooming?: A Lot Help from another person toileting, which includes using toliet, bedpan, or urinal?: A Lot Help from another person bathing (including washing, rinsing, drying)?: A Lot Help from another person to put on and taking off regular upper body clothing?: A Lot Help from another person to put on and taking off regular lower body clothing?: A Lot 6 Click Score: 12   End of Session    Activity Tolerance: Patient  tolerated treatment well Patient left: in bed;with call bell/phone within reach;with bed alarm set;with family/visitor present;with SCD's reapplied  OT Visit Diagnosis: Unsteadiness on feet (R26.81);Other abnormalities of gait and mobility (R26.89);Muscle weakness (generalized) (M62.81)                Time: 8119-14781112-1136 OT Time Calculation (min): 24 min Charges:  OT General Charges $OT Visit: 1 Visit OT Evaluation $OT Eval Low Complexity: 1 Low  Rockney GheeSerenity Nazair Fortenberry, M.S., OTR/L Ascom: [phone] 05/12/18, 12:19 PM

## 2018-05-12 NOTE — Progress Notes (Signed)
   05/12/18 1400  Clinical Encounter Type  Visited With Patient and family together  Visit Type Follow-up  Pt's former daughter-in-law wanted to fill out an AD for the pt. Pt, however, wasn't capable of understanding the significance of AD. Ch attempted to complete it and notary declined to notarize it. Ch offered apology to the family.

## 2018-05-12 NOTE — Clinical Social Work Placement (Signed)
   CLINICAL SOCIAL WORK PLACEMENT  NOTE  Date:  05/12/2018  Patient Details  Name: Connie Beck MRN: [id number] Date of Birth: [birth year]  Clinical Social Work is seeking post-discharge placement for this patient at the Skilled  Nursing Facility level of care (*CSW will initial, date and re-position this form in  chart as items are completed):  Yes   Patient/family provided with [facility] Social Work Department's list of facilities offering this level of care within the geographic area requested by the patient (or if unable, by the patient's family).  Yes   Patient/family informed of their freedom to choose among providers that offer the needed level of care, that participate in Medicare, Medicaid or managed care program needed by the patient, have an available bed and are willing to accept the patient.  Yes   Patient/family informed of [facility]'s ownership interest in [facility] and [facility], as well as of the fact that they are under no obligation to receive care at these facilities.  PASRR submitted to EDS on 05/12/18     PASRR number received on       Existing PASRR number confirmed on 05/12/18     FL2 transmitted to all facilities in geographic area requested by pt/family on 05/12/18     FL2 transmitted to all facilities within larger geographic area on       Patient informed that his/her managed care company has contracts with or will negotiate with certain facilities, including the following:            Patient/family informed of bed offers received.  Patient chooses bed at       Physician recommends and patient chooses bed at      Patient to be transferred to   on  .  Patient to be transferred to facility by       Patient family notified on   of transfer.  Name of family member notified:        PHYSICIAN       Additional Comment:    _______________________________________________ [name], LCSWA 05/12/2018, 9:37 AM

## 2018-05-12 NOTE — Progress Notes (Signed)
OT Cancellation Note  Patient Details Name: Connie Beck MRN: [id number] DOB: [birth year]   Cancelled Treatment:    Reason Eval/Treat Not Completed: Patient at procedure or test/ unavailable. Order received. Chart Reviewed. Attempt x2 this AM to see pt. Upon arrival to pt room pt working with SLP. Will re-attempt at a later time as pt available and medically appropriate.   Rockney Ghee, M.S., OTR/L Ascom: [phone] 05/12/18, 10:21 AM

## 2018-05-12 NOTE — Progress Notes (Signed)
   05/12/18 1100  Clinical Encounter Type  Visited With Family  Visit Type Initial  Stress Factors  Family Stress Factors Health changes    Patient's granddaughter Joni Reining) looking for the correct entrance/exit. Chaplain escorted her to destination. While en route, patient's granddaughter shared concerns about the patient's upcoming transition to a nursing facility. She indicates that this is difficult for the patient and their family and that she is "close to her grandmother". She indicates that her grandmother would appreciate a visit. Chaplain will follow up with the patient on the unit.

## 2018-05-12 NOTE — Evaluation (Signed)
Clinical/Bedside Swallow Evaluation Patient Details  Name: Connie Beck MRN: [id number] Date of Birth: 04/30/1923  Today's Date: 05/12/2018 Time: SLP Start Time (ACUTE ONLY): 0915 SLP Stop Time (ACUTE ONLY): 0950 SLP Time Calculation (min) (ACUTE ONLY): 35 min  Past Medical History:  Past Medical History:  Diagnosis Date  . Dizzy spells   . Hypertension    Past Surgical History:  Past Surgical History:  Procedure Laterality Date  . FRACTURE SURGERY  2018   pelvis   HPI:  Per admitting H&P: Connie Beck  is a 85 y.o. female with a known history of hypertension, vertigo previous history of UTI who presents to the hospital from home due to altered mental status.  Patient herself is somewhat confused and encephalopathic therefore most of the history obtained from the chart and from the ER physician.  Patient apparently is usually alert and somewhat oriented and today the home health nurse and physical therapist noticed that she was more confused than normal.  She was able to tolerate her physical therapy but remains somewhat altered.  There were a bit concerned and therefore sent the patient to the ER for further evaluation.  In the ER on urinalysis was noted to be positive for urinary tract infection.  Hospitalist services were contacted for admission   Assessment / Plan / Recommendation Clinical Impression  This very pleasant, but mildly confused 85 y/o female presents with positive s/s mild to moderate oral phase dysphagia and positive s/s suggestive of esophageal dysphagia. Oral phase deficits c/b decreased oral prep, mastication, mild to mod R buccal residue w/soft solids only and mild to mod A-P transit delay. Oral mech exam revealed generalized bilateral weakness of all structures. Noted reduced R>L buccal ROM & asymmetry when prompted to smile. Adequate lip seal was observed around cup, with no anterior spillage. No IMMEDIATE s/s aspiration with any consistency tested (thin liquid, puree,  soft solid). Vocal quality remained clear throughout evaluation. However DELAYED COUGHING x2 following 2 separate trials of soft solid and regurgitation of boluses. This is most consistent with presentation of possible ESOPHAGEAL dysphagia. Family reports hx of difficulty swallowing meats and coughing both during meals and throughout the day. Pt appeared to tolerate small tsp puree and small single sips thin liquid much better with no s/s aspiration and improved oral prep & A-P transit time. Recommend Dysphagia I diet (PUREE) w/thin liquids. Recommend REFLUX precautions in light of presentation: ensure pt is fully upright for all meals and remain upright for 60-90 min after meals; ALTERNATE small tsp puree with SMALL sips thin liquid; avoid acidic foods: OJ, coffee, NO STRAWS. Please give meds whole in puree. Rec. GI consult re: esophageal assessment to further investigate esophageal phase of swallow. SLP to f/u with toleration of diet 1-3 days. SLP Visit Diagnosis: Dysphagia, unspecified (R13.10)    Aspiration Risk  Moderate aspiration risk;Risk for inadequate nutrition/hydration    Diet Recommendation Dysphagia 1 (Puree);Thin liquid   Liquid Administration via: Cup;No straw Medication Administration: Whole meds with puree Supervision: Full supervision/cueing for compensatory strategies Compensations: Minimize environmental distractions;Slow rate;Small sips/bites;Lingual sweep for clearance of pocketing;Follow solids with liquid Postural Changes: Seated upright at 90 degrees;Remain upright for at least 30 minutes after po intake    Other  Recommendations Recommended Consults: Consider GI evaluation;Consider esophageal assessment Oral Care Recommendations: Oral care QID;Oral care before and after PO   Follow up Recommendations Skilled Nursing facility      Frequency and Duration min 1 x/week  1 week  Prognosis Prognosis for Safe Diet Advancement: Fair Barriers to Reach Goals: Cognitive  deficits;Severity of deficits      Swallow Study   General Date of Onset: 05/12/18 HPI: Per admitting H&P: Connie Beck  is a 85 y.o. female with a known history of hypertension, vertigo previous history of UTI who presents to the hospital from home due to altered mental status.  Patient herself is somewhat confused and encephalopathic therefore most of the history obtained from the chart and from the ER physician.  Patient apparently is usually alert and somewhat oriented and today the home health nurse and physical therapist noticed that she was more confused than normal.  She was able to tolerate her physical therapy but remains somewhat altered.  There were a bit concerned and therefore sent the patient to the ER for further evaluation.  In the ER on urinalysis was noted to be positive for urinary tract infection.  Hospitalist services were contacted for admission Type of Study: Bedside Swallow Evaluation Diet Prior to this Study: Regular;Thin liquids Temperature Spikes Noted: Yes Respiratory Status: Room air History of Recent Intubation: No Behavior/Cognition: Cooperative;Pleasant mood;Confused;Lethargic/Drowsy;Distractible;Requires cueing Oral Cavity Assessment: Dried secretions Oral Cavity - Dentition: Poor condition;Missing dentition;Other (Comment)(in process of getting dentures per pt report) Self-Feeding Abilities: Total assist Patient Positioning: Upright in bed Baseline Vocal Quality: Hoarse;Low vocal intensity Volitional Cough: Strong Volitional Swallow: Able to elicit    Oral/Motor/Sensory Function Overall Oral Motor/Sensory Function: Moderate impairment Facial ROM: Reduced right;Reduced left;Other (Comment)(decreased R>L) Facial Symmetry: Abnormal symmetry left Facial Strength: Reduced right;Reduced left Lingual ROM: Reduced right;Reduced left Lingual Symmetry: Within Functional Limits Lingual Strength: Reduced Mandible: Impaired   Ice Chips Ice chips: Not tested   Thin  Liquid Thin Liquid: Within functional limits Presentation: Cup;Straw    Nectar Thick Nectar Thick Liquid: Not tested   Honey Thick Honey Thick Liquid: Not tested   Puree Puree: Within functional limits Presentation: Spoon   Solid     Solid: Impaired Presentation: Spoon Oral Phase Impairments: Reduced lingual movement/coordination;Impaired mastication Oral Phase Functional Implications: Right lateral sulci pocketing;Prolonged oral transit;Impaired mastication;Oral residue Pharyngeal Phase Impairments: Cough - Delayed      Deaundra Dupriest, MA, CCC-SLP 05/12/2018,10:31 AM

## 2018-05-12 NOTE — Progress Notes (Signed)
Black folder with patient information that was brought in from home was given back to the family

## 2018-05-12 NOTE — Progress Notes (Signed)
Physical Therapy Treatment Patient Details Name: Connie Beck MRN: [id number] DOB: [birth year] Today's Date: 05/12/2018    History of Present Illness Patient is 85 yo female admitted for UTI and AMS. PMH of HTN, vertigo, pelvis fx    PT Comments    Pt agreeable to PT for bed exercises only. Pt notes her arms/upper body hurt and she does not wish to do anything with them. Pt extremely hard of hearing; participates well with lower extremity exercises with increased verbal and tactile cues. Assist as needed. Continue PT to progress strength and endurance to improve functional mobility.    Follow Up Recommendations  SNF     Equipment Recommendations       Recommendations for Other Services       Precautions / Restrictions Precautions Precautions: Fall Restrictions Weight Bearing Restrictions: No    Mobility  Bed Mobility               General bed mobility comments: Deferred; pt states her arms and upper body her "I don't want to do anything with my arms"  Transfers                    Ambulation/Gait                 Stairs             Wheelchair Mobility    Modified Rankin (Stroke Patients Only)       Balance                                            Cognition Arousal/Alertness: Awake/alert Behavior During Therapy: WFL for tasks assessed/performed Overall Cognitive Status: No family/caregiver present to determine baseline cognitive functioning                                        Exercises General Exercises - Lower Extremity Ankle Circles/Pumps: AROM;Both;15 reps;Supine Quad Sets: Strengthening;Both;10 reps;Supine Gluteal Sets: Strengthening;Both;10 reps;Supine Short Arc Quad: AROM;Both;10 reps;Supine Heel Slides: AAROM;Both;10 reps;Supine Hip ABduction/ADduction: AROM;AAROM;Both;10 reps;Supine Straight Leg Raises: AAROM;Both;10 reps;Supine    General Comments        Pertinent Vitals/Pain  Pain Assessment: Faces Faces Pain Scale: Hurts little more Pain Location: B arms/upper body Pain Intervention(s): Limited activity within patient's tolerance    Home Living                      Prior Function            PT Goals (current goals can now be found in the care plan section) Progress towards PT goals: Progressing toward goals(slowly)    Frequency    Min 2X/week      PT Plan Current plan remains appropriate    Co-evaluation              AM-PAC PT "6 Clicks" Mobility   Outcome Measure  Help needed turning from your back to your side while in a flat bed without using bedrails?: A Lot Help needed moving from lying on your back to sitting on the side of a flat bed without using bedrails?: A Lot Help needed moving to and from a bed to a chair (including a wheelchair)?: Total Help needed standing up from a chair using your arms (  e.g., wheelchair or bedside chair)?: A Lot Help needed to walk in hospital room?: Total Help needed climbing 3-5 steps with a railing? : Total 6 Click Score: 9    End of Session   Activity Tolerance: Patient limited by fatigue;Other (comment);Patient limited by pain Patient left: in bed;with call bell/phone within reach;with bed alarm set   PT Visit Diagnosis: Difficulty in walking, not elsewhere classified (R26.2);Muscle weakness (generalized) (M62.81);Other abnormalities of gait and mobility (R26.89)     Time: 3557-3220 PT Time Calculation (min) (ACUTE ONLY): 18 min  Charges:  $Therapeutic Exercise: 8-22 mins                      Scot Dock, PTA 05/12/2018, 4:53 PM

## 2018-05-13 DIAGNOSIS — E440 Moderate protein-calorie malnutrition: Secondary | ICD-10-CM

## 2018-05-13 LAB — RPR: RPR Ser Ql: NONREACTIVE

## 2018-05-13 LAB — POTASSIUM: POTASSIUM: 3.4 mmol/L — AB (ref 3.5–5.1)

## 2018-05-13 LAB — HIV ANTIBODY (ROUTINE TESTING W REFLEX): HIV Screen 4th Generation wRfx: NONREACTIVE

## 2018-05-13 MED ORDER — POTASSIUM CHLORIDE CRYS ER 20 MEQ PO TBCR
40.0000 meq | EXTENDED_RELEASE_TABLET | Freq: Once | ORAL | Status: AC
  Administered 2018-05-13: 40 meq via ORAL
  Filled 2018-05-13: qty 2

## 2018-05-13 MED ORDER — BISACODYL 10 MG RE SUPP
10.0000 mg | Freq: Once | RECTAL | Status: AC
  Administered 2018-05-13: 10 mg via RECTAL
  Filled 2018-05-13: qty 1

## 2018-05-13 NOTE — Clinical Social Work Note (Signed)
CSW spoke with patient's POA Marijo File [phone] regarding bed offers for patient. POA has chosen [facility]. CSW notified Tina at [facility] of bed acceptance. Inetta Fermo will begin [employer] authorization. CSW will continue to follow for discharge planning.   [name] MSW, [address] [phone]

## 2018-05-13 NOTE — Progress Notes (Signed)
Physical Therapy Treatment Patient Details Name: Connie Beck MRN: [id number] DOB: [birth year] Today's Date: 05/13/2018    History of Present Illness 85 yo female admitted for UTI and AMS. PMH of HTN, vertigo, pelvis fx    PT Comments    Pt pleasantly confused t/o the session.  She was eager to do exercises though even with nearly constant cuing she struggled to fully perform as instructed.  She did well with attempt to get to EOB, but ultimately needed a lot of assist with getting to EOB.  Initially unable to get up to sitting or maintain w/o assist, but once assisted to appropriate/balance position she was able to maintain balance without direct physical assist for ~2 minutes.    Follow Up Recommendations  SNF     Equipment Recommendations       Recommendations for Other Services       Precautions / Restrictions Precautions Precautions: Fall Restrictions Weight Bearing Restrictions: No    Mobility  Bed Mobility Overal bed mobility: Needs Assistance Bed Mobility: Supine to Sit;Sit to Supine     Supine to sit: Mod assist;Max assist Sit to supine: Mod assist;Max assist   General bed mobility comments: Pt showed some attempt to get to EOB but needed considerable assist to get to/from sitting.  She was able to maintain sitting at EOB initially needing assist but did tolerate ~2 minutes with no direct assist (close CGA)  Transfers                 General transfer comment: pt initially showed some interest in standing, but ultimately started to try getting back to supine  Ambulation/Gait                 Stairs             Wheelchair Mobility    Modified Rankin (Stroke Patients Only)       Balance                                            Cognition Arousal/Alertness: Awake/alert   Overall Cognitive Status: History of cognitive impairments - at baseline                                 General Comments: pt very  confused t/o the session      Exercises General Exercises - Lower Extremity Ankle Circles/Pumps: AROM;Both;15 reps;Supine Quad Sets: Strengthening;Both;10 reps;Supine Gluteal Sets: Strengthening;Both;10 reps;Supine Short Arc Quad: AROM;Both;10 reps;Supine Heel Slides: AAROM;Both;10 reps;Supine Hip ABduction/ADduction: AROM;AAROM;Both;10 reps;Supine Straight Leg Raises: AROM;10 reps;Both(b/l limited ROM, but AROM again gravity)    General Comments        Pertinent Vitals/Pain Pain Assessment: No/denies pain(pt did not indicate pain with any exercises)    Home Living                      Prior Function            PT Goals (current goals can now be found in the care plan section) Progress towards PT goals: Progressing toward goals    Frequency    Min 2X/week      PT Plan Current plan remains appropriate    Co-evaluation              AM-PAC PT "6 Clicks" Mobility  Outcome Measure  Help needed turning from your back to your side while in a flat bed without using bedrails?: A Lot Help needed moving from lying on your back to sitting on the side of a flat bed without using bedrails?: Total Help needed moving to and from a bed to a chair (including a wheelchair)?: Total Help needed standing up from a chair using your arms (e.g., wheelchair or bedside chair)?: Total Help needed to walk in hospital room?: Total Help needed climbing 3-5 steps with a railing? : Total 6 Click Score: 7    End of Session Equipment Utilized During Treatment: Gait belt Activity Tolerance: Patient tolerated treatment well Patient left: with bed alarm set;with call bell/phone within reach   PT Visit Diagnosis: Difficulty in walking, not elsewhere classified (R26.2);Muscle weakness (generalized) (M62.81);Other abnormalities of gait and mobility (R26.89)     Time: 6301-6010 PT Time Calculation (min) (ACUTE ONLY): 16 min  Charges:  $Therapeutic Exercise: 8-22 mins                      Malachi Pro, DPT 05/13/2018, 5:31 PM

## 2018-05-13 NOTE — Progress Notes (Signed)
[employer] [facility] [facility] [place] [facility]   PATIENT NAME: Arneda Fall    MR#:  [id number]  DATE OF BIRTH:  05-30-1923  SUBJECTIVE:   Doing well this morning.  Denies any chest pain, shortness of breath, abdominal pain.  REVIEW OF SYSTEMS:  Review of Systems  Constitutional: Negative for chills and fever.  HENT: Negative for congestion and sore throat.   Eyes: Negative for blurred vision and double vision.  Respiratory: Negative for cough, sputum production and shortness of breath.   Cardiovascular: Negative for chest pain and palpitations.  Gastrointestinal: Negative for nausea and vomiting.  Genitourinary: Negative for dysuria and urgency.  Musculoskeletal: Positive for back pain. Negative for neck pain.  Neurological: Negative for dizziness and headaches.  Psychiatric/Behavioral: Negative for depression. The patient is not nervous/anxious.    DRUG ALLERGIES:   Allergies  Allergen Reactions  . Darvon [Propoxyphene]   . Penicillins     Has patient had a PCN reaction causing immediate rash, facial/tongue/throat swelling, SOB or lightheadedness with hypotension: Unknown Has patient had a PCN reaction causing severe rash involving mucus membranes or skin necrosis: Unknown Has patient had a PCN reaction that required hospitalization: Unknown Has patient had a PCN reaction occurring within the last 10 years: No If all of the above answers are "NO", then may proceed with Cephalosporin use.  . Sulfa Antibiotics    VITALS:  Blood pressure (!) 178/92, pulse 74, temperature (!) 97.5 F (36.4 C), temperature source Oral, resp. rate 20, height 4\' 9"  (1.448 m), weight 52.3 kg, SpO2 97 %. PHYSICAL EXAMINATION:  Physical Exam  GENERAL:  85 y.o. patient sitting up in bed, in no acute distress. EYES: Pupils equal, round, reactive to light and accommodation. No scleral icterus. Extraocular muscles intact.  HEENT: Head atraumatic, normocephalic. Oropharynx and nasopharynx  clear. No oropharyngeal erythema.  Mildly dry mucous membranes. NECK:  Supple, no jugular venous distention. No thyroid enlargement, no tenderness.  LUNGS: Normal breath sounds bilaterally, no wheezing, rales, rhonchi. No use of accessory muscles of respiration.  CARDIOVASCULAR: S1, S2, RRR. No murmurs, rubs, gallops, clicks.  ABDOMEN: Soft, nontender, nondistended. Bowel sounds present. No organomegaly or mass.  EXTREMITIES: No pedal edema, cyanosis, or clubbing. + 2 pedal & radial pulses b/l.   NEUROLOGIC: Cranial nerves II through XII grossly intact, no focal deficits, + global weakness, sensation intact throughout. PSYCHIATRIC: Awake and alert, oriented x2 SKIN: No obvious rash, lesion, or ulcer.  LABORATORY PANEL:  Female CBC Recent Labs  Lab 05/12/18 0433  WBC 10.0  HGB 11.2*  HCT 34.1*  PLT 256   ------------------------------------------------------------------------------------------------------------------ Chemistries  Recent Labs  Lab 05/09/18 1431  05/12/18 0433 05/13/18 0821  NA 141   < > 142  --   K 3.6   < > 3.1* 3.4*  CL 107   < > 113*  --   CO2 24   < > 23  --   GLUCOSE 105*   < > 88  --   BUN 19   < > 17  --   CREATININE 1.46*   < > 1.15*  --   CALCIUM 9.2   < > 8.3*  --   MG  --    < > 2.2  --   AST 22  --   --   --   ALT 9  --   --   --   ALKPHOS 72  --   --   --   BILITOT 0.9  --   --   --    < > =  values in this interval not displayed.   RADIOLOGY:  No results found. ASSESSMENT AND PLAN:   1. AMS- resolved. Initially felt to be secondary to UTI, but urine culture growing yeast. May just be a worsening of patient's underlying dementia. CT head negative. TSH, ammonia, B12, folate, HIV, RPR unremarkable.  2.  Yeast in urine- UA consistent with infection, but urine culture growing yeast. -Received 3 days of ceftriaxone  3.  Depression-continue Lexapro  4.  History of vertigo-continue meclizine, midodrine.  5.  Generalized weakness -PT/OT  recommending SNF  6.  Hypokalemia- improving. K 3.4 -Replete and recheck  7.  Chronic L2 compression fracture- seen on lumbar x-ray yesterday. -Supportive care  8. Dysphagia- family concerned about patient's swallowing -SLP recommending dysphagia 1 diet  Patient is medically stable for discharge. Awaiting placement at SNF.  All the records are reviewed and case discussed with Care Management/Social Worker. Management plans discussed with the patient, family and they are in agreement.  CODE STATUS: Full Code  TOTAL TIME TAKING CARE OF THIS PATIENT: 35 minutes.   More than 50% of the time was spent in counseling/coordination of care: YES  POSSIBLE D/C tomorrow, DEPENDING ON CLINICAL CONDITION.   Jinny Blossom Marija Calamari M.D on 05/13/2018 at 1:20 PM  Between 7am to 6pm - Pager - [phone]  After 6pm go to [email or web address] - Scientist, research (life sciences) [facility]  Office  [phone]  CC: Primary care physician; Center, [facility]  Note: This dictation was prepared with Nurse, children's dictation along with smaller phrase technology. Any transcriptional errors that result from this process are unintentional.

## 2018-05-14 LAB — POTASSIUM: POTASSIUM: 4.2 mmol/L (ref 3.5–5.1)

## 2018-05-14 NOTE — Care Management Important Message (Signed)
Important Message  Patient Details  Name: Connie Beck MRN: [id number] Date of Birth: 08/19/1923   Medicare Important Message Given:  Yes    [name] A Juanell Saffo 05/14/2018, 11:51 AM

## 2018-05-14 NOTE — Discharge Instructions (Signed)
Acute Back Pain, Adult Acute back pain is sudden and usually short-lived. It is often caused by an injury to the muscles and tissues in the back. The injury may result from:  A muscle or ligament getting overstretched or torn (strained). Ligaments are tissues that connect bones to each other. Lifting something improperly can cause a back strain.  Wear and tear (degeneration) of the spinal disks. Spinal disks are circular tissue that provides cushioning between the bones of the spine (vertebrae).  Twisting motions, such as while playing sports or doing yard work.  A hit to the back.  Arthritis. You may have a physical exam, lab tests, and imaging tests to find the cause of your pain. Acute back pain usually goes away with rest and home care. Follow these instructions at home: Managing pain, stiffness, and swelling  Take over-the-counter and prescription medicines only as told by your health care provider.  Your health care provider may recommend applying ice during the first 24-48 hours after your pain starts. To do this: ? Put ice in a plastic bag. ? Place a towel between your skin and the bag. ? Leave the ice on for 20 minutes, 2-3 times a day.  If directed, apply heat to the affected area as often as told by your health care provider. Use the heat source that your health care provider recommends, such as a moist heat pack or a heating pad. ? Place a towel between your skin and the heat source. ? Leave the heat on for 20-30 minutes. ? Remove the heat if your skin turns bright red. This is especially important if you are unable to feel pain, heat, or cold. You have a greater risk of getting burned. Activity   Do not stay in bed. Staying in bed for more than 1-2 days can delay your recovery.  Sit up and stand up straight. Avoid leaning forward when you sit, or hunching over when you stand. ? If you work at a desk, sit close to it so you do not need to lean over. Keep your chin tucked  in. Keep your neck drawn back, and keep your elbows bent at a right angle. Your arms should look like the letter "L." ? Sit high and close to the steering wheel when you drive. Add lower back (lumbar) support to your car seat, if needed.  Take short walks on even surfaces as soon as you are able. Try to increase the length of time you walk each day.  Do not sit, drive, or stand in one place for more than 30 minutes at a time. Sitting or standing for long periods of time can put stress on your back.  Do not drive or use heavy machinery while taking prescription pain medicine.  Use proper lifting techniques. When you bend and lift, use positions that put less stress on your back: ? Crenshaw your knees. ? Keep the load close to your body. ? Avoid twisting.  Exercise regularly as told by your health care provider. Exercising helps your back heal faster and helps prevent back injuries by keeping muscles strong and flexible.  Work with a physical therapist to make a safe exercise program, as recommended by your health care provider. Do any exercises as told by your physical therapist. Lifestyle  Maintain a healthy weight. Extra weight puts stress on your back and makes it difficult to have good posture.  Avoid activities or situations that make you feel anxious or stressed. Stress and anxiety increase muscle  tension and can make back pain worse. Learn ways to manage anxiety and stress, such as through exercise. General instructions  Sleep on a firm mattress in a comfortable position. Try lying on your side with your knees slightly bent. If you lie on your back, put a pillow under your knees.  Follow your treatment plan as told by your health care provider. This may include: ? Cognitive or behavioral therapy. ? Acupuncture or massage therapy. ? Meditation or yoga. Contact a health care provider if:  You have pain that is not relieved with rest or medicine.  You have increasing pain going down  into your legs or buttocks.  Your pain does not improve after 2 weeks.  You have pain at night.  You lose weight without trying.  You have a fever or chills. Get help right away if:  You develop new bowel or bladder control problems.  You have unusual weakness or numbness in your arms or legs.  You develop nausea or vomiting.  You develop abdominal pain.  You feel faint. Summary  Acute back pain is sudden and usually short-lived.  Use proper lifting techniques. When you bend and lift, use positions that put less stress on your back.  Take over-the-counter and prescription medicines and apply heat or ice as directed by your health care provider. This information is not intended to replace advice given to you by your health care provider. Make sure you discuss any questions you have with your health care provider. Document Released: 03/26/2005 Document Revised: 10/31/2017 Document Reviewed: 11/07/2016 Elsevier Interactive Patient Education  2019 [employer] Inc[employer]   Back Exercises If you have pain in your back, do these exercises 2-3 times each day or as told by your doctor. When the pain goes away, do the exercises once each day, but repeat the steps more times for each exercise (do more repetitions). If you do not have pain in your back, do these exercises once each day or as told by your doctor. Exercises Single Knee to Chest Do these steps 3-5 times in a row for each leg: 1. Lie on your back on a firm bed or the floor with your legs stretched out. 2. Bring one knee to your chest. 3. Hold your knee to your chest by grabbing your knee or thigh. 4. Pull on your knee until you feel a gentle stretch in your lower back. 5. Keep doing the stretch for 10-30 seconds. 6. Slowly let go of your leg and straighten it. Pelvic Tilt Do these steps 5-10 times in a row: 1. Lie on your back on a firm bed or the floor with your legs stretched out. 2. Bend your knees so they point up to the  ceiling. Your feet should be flat on the floor. 3. Tighten your lower belly (abdomen) muscles to press your lower back against the floor. This will make your tailbone point up to the ceiling instead of pointing down to your feet or the floor. 4. Stay in this position for 5-10 seconds while you gently tighten your muscles and breathe evenly. Cat-Cow Do these steps until your lower back bends more easily: 1. Get on your hands and knees on a firm surface. Keep your hands under your shoulders, and keep your knees under your hips. You may put padding under your knees. 2. Let your head hang down, and make your tailbone point down to the floor so your lower back is round like the back of a cat. 3. Stay in this  position for 5 seconds. 4. Slowly lift your head and make your tailbone point up to the ceiling so your back hangs low (sags) like the back of a cow. 5. Stay in this position for 5 seconds.  Press-Ups Do these steps 5-10 times in a row: 1. Lie on your belly (face-down) on the floor. 2. Place your hands near your head, about shoulder-width apart. 3. While you keep your back relaxed and keep your hips on the floor, slowly straighten your arms to raise the top half of your body and lift your shoulders. Do not use your back muscles. To make yourself more comfortable, you may change where you place your hands. 4. Stay in this position for 5 seconds. 5. Slowly return to lying flat on the floor.  Bridges Do these steps 10 times in a row: 1. Lie on your back on a firm surface. 2. Bend your knees so they point up to the ceiling. Your feet should be flat on the floor. 3. Tighten your butt muscles and lift your butt off of the floor until your waist is almost as high as your knees. If you do not feel the muscles working in your butt and the back of your thighs, slide your feet 1-2 inches farther away from your butt. 4. Stay in this position for 3-5 seconds. 5. Slowly lower your butt to the floor, and  let your butt muscles relax. If this exercise is too easy, try doing it with your arms crossed over your chest. Belly Crunches Do these steps 5-10 times in a row: 1. Lie on your back on a firm bed or the floor with your legs stretched out. 2. Bend your knees so they point up to the ceiling. Your feet should be flat on the floor. 3. Cross your arms over your chest. 4. Tip your chin a little bit toward your chest but do not bend your neck. 5. Tighten your belly muscles and slowly raise your chest just enough to lift your shoulder blades a tiny bit off of the floor. 6. Slowly lower your chest and your head to the floor. Back Lifts Do these steps 5-10 times in a row: 1. Lie on your belly (face-down) with your arms at your sides, and rest your forehead on the floor. 2. Tighten the muscles in your legs and your butt. 3. Slowly lift your chest off of the floor while you keep your hips on the floor. Keep the back of your head in line with the curve in your back. Look at the floor while you do this. 4. Stay in this position for 3-5 seconds. 5. Slowly lower your chest and your face to the floor. Contact a doctor if:  Your back pain gets a lot worse when you do an exercise.  Your back pain does not lessen 2 hours after you exercise. If you have any of these problems, stop doing the exercises. Do not do them again unless your doctor says it is okay. Get help right away if:  You have sudden, very bad back pain. If this happens, stop doing the exercises. Do not do them again unless your doctor says it is okay. This information is not intended to replace advice given to you by your health care provider. Make sure you discuss any questions you have with your health care provider. Document Released: 04/28/2010 Document Revised: 12/18/2017 Document Reviewed: 05/20/2014 Elsevier Interactive Patient Education  2019 [employer]   Back Exercises The following exercises strengthen the  muscles that help  to support the back. They also help to keep the lower back flexible. Doing these exercises can help to prevent back pain or lessen existing pain. If you have back pain or discomfort, try doing these exercises 2-3 times each day or as told by your health care provider. When the pain goes away, do them once each day, but increase the number of times that you repeat the steps for each exercise (do more repetitions). If you do not have back pain or discomfort, do these exercises once each day or as told by your health care provider. Exercises Single Knee to Chest Repeat these steps 3-5 times for each leg: 1. Lie on your back on a firm bed or the floor with your legs extended. 2. Bring one knee to your chest. Your other leg should stay extended and in contact with the floor. 3. Hold your knee in place by grabbing your knee or thigh. 4. Pull on your knee until you feel a gentle stretch in your lower back. 5. Hold the stretch for 10-30 seconds. 6. Slowly release and straighten your leg. Pelvic Tilt Repeat these steps 5-10 times: 1. Lie on your back on a firm bed or the floor with your legs extended. 2. Bend your knees so they are pointing toward the ceiling and your feet are flat on the floor. 3. Tighten your lower abdominal muscles to press your lower back against the floor. This motion will tilt your pelvis so your tailbone points up toward the ceiling instead of pointing to your feet or the floor. 4. With gentle tension and even breathing, hold this position for 5-10 seconds. Cat-Cow Repeat these steps until your lower back becomes more flexible: 1. Get into a hands-and-knees position on a firm surface. Keep your hands under your shoulders, and keep your knees under your hips. You may place padding under your knees for comfort. 2. Let your head hang down, and point your tailbone toward the floor so your lower back becomes rounded like the back of a cat. 3. Hold this position for 5  seconds. 4. Slowly lift your head and point your tailbone up toward the ceiling so your back forms a sagging arch like the back of a cow. 5. Hold this position for 5 seconds.  Press-Ups Repeat these steps 5-10 times: 1. Lie on your abdomen (face-down) on the floor. 2. Place your palms near your head, about shoulder-width apart. 3. While you keep your back as relaxed as possible and keep your hips on the floor, slowly straighten your arms to raise the top half of your body and lift your shoulders. Do not use your back muscles to raise your upper torso. You may adjust the placement of your hands to make yourself more comfortable. 4. Hold this position for 5 seconds while you keep your back relaxed. 5. Slowly return to lying flat on the floor.  Bridges Repeat these steps 10 times: 1. Lie on your back on a firm surface. 2. Bend your knees so they are pointing toward the ceiling and your feet are flat on the floor. 3. Tighten your buttocks muscles and lift your buttocks off of the floor until your waist is at almost the same height as your knees. You should feel the muscles working in your buttocks and the back of your thighs. If you do not feel these muscles, slide your feet 1-2 inches farther away from your buttocks. 4. Hold this position for 3-5 seconds. 5. Slowly lower  your hips to the starting position, and allow your buttocks muscles to relax completely. If this exercise is too easy, try doing it with your arms crossed over your chest. Abdominal Crunches Repeat these steps 5-10 times: 1. Lie on your back on a firm bed or the floor with your legs extended. 2. Bend your knees so they are pointing toward the ceiling and your feet are flat on the floor. 3. Cross your arms over your chest. 4. Tip your chin slightly toward your chest without bending your neck. 5. Tighten your abdominal muscles and slowly raise your trunk (torso) high enough to lift your shoulder blades a tiny bit off of the  floor. Avoid raising your torso higher than that, because it can put too much stress on your low back and it does not help to strengthen your abdominal muscles. 6. Slowly return to your starting position. Back Lifts Repeat these steps 5-10 times: 1. Lie on your abdomen (face-down) with your arms at your sides, and rest your forehead on the floor. 2. Tighten the muscles in your legs and your buttocks. 3. Slowly lift your chest off of the floor while you keep your hips pressed to the floor. Keep the back of your head in line with the curve in your back. Your eyes should be looking at the floor. 4. Hold this position for 3-5 seconds. 5. Slowly return to your starting position. Contact a health care provider if:  Your back pain or discomfort gets much worse when you do an exercise.  Your back pain or discomfort does not lessen within 2 hours after you exercise. If you have any of these problems, stop doing these exercises right away. Do not do them again unless your health care provider says that you can. Get help right away if:  You develop sudden, severe back pain. If this happens, stop doing the exercises right away. Do not do them again unless your health care provider says that you can. This information is not intended to replace advice given to you by your health care provider. Make sure you discuss any questions you have with your health care provider. Document Released: 05/03/2004 Document Revised: 07/30/2017 Document Reviewed: 05/20/2014 Elsevier Interactive Patient Education  [employer].

## 2018-05-14 NOTE — Progress Notes (Signed)
Pt to discharge to [facility] via ems . Ems called  Earlier. Sl d/cd.  Pt in transfer pack. No distress.report called to christine at [facility]. Waiting ems

## 2018-05-14 NOTE — Discharge Summary (Addendum)
[facility] - [place] at [facility]   PATIENT NAME: Connie Beck    MR#:  [id number]  DATE OF BIRTH:  [birth year]  DATE OF ADMISSION:  05/09/2018   ADMITTING PHYSICIAN: [place] SirenVivek J Sainani, MD  DATE OF DISCHARGE: 05/14/18  PRIMARY CARE PHYSICIAN: [facility]   ADMISSION DIAGNOSIS:  Lower urinary tract infectious disease [N39.0] Altered mental status, unspecified altered mental status type [R41.82] DISCHARGE DIAGNOSIS:  Active Problems:   Altered mental status   Malnutrition of moderate degree  SECONDARY DIAGNOSIS:   Past Medical History:  Diagnosis Date  . Dizzy spells   . Hypertension    HOSPITAL COURSE:   Connie Beck is a 85 year old female who presented to the ED with altered mental status. She was admitted for further management.  1. AMS- resolved.  -Initially felt to be secondary to UTI, but urine culture growing yeast. May just be a worsening of patient's underlying dementia.  -CT head negative.  -TSH, ammonia, B12, folate, HIV, RPR unremarkable.  2. Yeast in urine -UA consistent with infection, but urine culture growing yeast. -Received 3 days of ceftriaxone  3. Depression-continued Lexapro  4. History of vertigo -Continued meclizine -Midodrine stopped this hospitalization due to elevated blood pressures. Can be restarted as an outpatient as needed.  5.  Generalized weakness -PT/OT recommended SNF  6.  Chronic L2 compression fracture- seen on lumbar x-ray. -Supportive care -Tylenol prn  7. Dysphagia -SLP recommending dysphagia 1 diet  DISCHARGE CONDITIONS:  Depression History of vertigo Chronic L2 compression fracture Dysphagia CONSULTS OBTAINED:  None DRUG ALLERGIES:   Allergies  Allergen Reactions  . Darvon [Propoxyphene]   . Penicillins     Has patient had a PCN reaction causing immediate rash, facial/tongue/throat swelling, SOB or lightheadedness with hypotension: Unknown Has patient had a PCN  reaction causing severe rash involving mucus membranes or skin necrosis: Unknown Has patient had a PCN reaction that required hospitalization: Unknown Has patient had a PCN reaction occurring within the last 10 years: No If all of the above answers are "NO", then may proceed with Cephalosporin use.  . Sulfa Antibiotics    DISCHARGE MEDICATIONS:   Allergies as of 05/14/2018      Reactions   Darvon [propoxyphene]    Penicillins    Has patient had a PCN reaction causing immediate rash, facial/tongue/throat swelling, SOB or lightheadedness with hypotension: Unknown Has patient had a PCN reaction causing severe rash involving mucus membranes or skin necrosis: Unknown Has patient had a PCN reaction that required hospitalization: Unknown Has patient had a PCN reaction occurring within the last 10 years: No If all of the above answers are "NO", then may proceed with Cephalosporin use.   Sulfa Antibiotics       Medication List    STOP taking these medications   midodrine 2.5 MG tablet Commonly known as:  PROAMATINE     TAKE these medications   docusate sodium 100 MG capsule Commonly known as:  COLACE Take 100 mg by mouth daily.   escitalopram 5 MG tablet Commonly known as:  LEXAPRO Take 5 mg by mouth daily.   meclizine 25 MG tablet Commonly known as:  ANTIVERT Take 25 mg by mouth daily.        DISCHARGE INSTRUCTIONS:  1. F/u with PCP in 5 days 2. Midodrine stopped this admission due to elevated BPs. Can be restarted as needed as an outpatient. DIET:  Dysphagia 1 (Puree);Thin liquid   Liquid Administration via: Cup;No  straw Medication Administration: Whole meds with puree Supervision: Full supervision/cueing for compensatory strategies Compensations: Minimize environmental distractions;Slow rate;Small sips/bites;Lingual sweep for clearance of pocketing;Follow solids with liquid Postural Changes: Seated upright at 90 degrees;Remain upright for at least 30 minutes after po  intake  DISCHARGE CONDITION:  Stable ACTIVITY:  Activity as tolerated OXYGEN:  Home Oxygen: No.  Oxygen Delivery: room air DISCHARGE LOCATION:  nursing home   If you experience worsening of your admission symptoms, develop shortness of breath, life threatening emergency, suicidal or homicidal thoughts you must seek medical attention immediately by calling 911 or calling your MD immediately  if symptoms less severe.  You Must read complete instructions/literature along with all the possible adverse reactions/side effects for all the Medicines you take and that have been prescribed to you. Take any new Medicines after you have completely understood and accpet all the possible adverse reactions/side effects.   Please note  You were cared for by a hospitalist during your hospital stay. If you have any questions about your discharge medications or the care you received while you were in the hospital after you are discharged, you can call the unit and asked to speak with the hospitalist on call if the hospitalist that took care of you is not available. Once you are discharged, your primary care physician will handle any further medical issues. Please note that NO REFILLS for any discharge medications will be authorized once you are discharged, as it is imperative that you return to your primary care physician (or establish a relationship with a primary care physician if you do not have one) for your aftercare needs so that they can reassess your need for medications and monitor your lab values.    On the day of Discharge:  VITAL SIGNS:  Blood pressure (!) 171/80, pulse 80, temperature 98.4 F (36.9 C), temperature source Oral, resp. rate 20, height 4\' 9"  (1.448 m), weight 52.3 kg, SpO2 95 %. PHYSICAL EXAMINATION:  GENERAL: 85 y.o. patient sitting up in bed, in no acute distress. EYES: Pupils equal, round, reactive to light and accommodation. No scleral icterus. Extraocular muscles intact.    HEENT: Head atraumatic, normocephalic. Oropharynx and nasopharynx clear. No oropharyngeal erythema.  Mildly dry mucous membranes. NECK: Supple, no jugular venous distention. No thyroid enlargement, no tenderness.  LUNGS: Normal breath sounds bilaterally, no wheezing, rales, rhonchi. No use of accessory muscles of respiration.  CARDIOVASCULAR: S1, S2, RRR. No murmurs, rubs, gallops, clicks.  ABDOMEN: Soft, nontender, nondistended. Bowel sounds present. No organomegaly or mass.  EXTREMITIES: No pedal edema, cyanosis, or clubbing. + 2 pedal &radial pulses b/l.  NEUROLOGIC: Cranial nerves II through XII grossly intact, no focal deficits, + global weakness, sensation intact throughout. PSYCHIATRIC: Awake and alert, oriented x2 SKIN: No obvious rash, lesion, or ulcer. DATA REVIEW:   CBC Recent Labs  Lab 05/12/18 0433  WBC 10.0  HGB 11.2*  HCT 34.1*  PLT 256    Chemistries  Recent Labs  Lab 05/09/18 1431  05/12/18 0433  05/14/18 0348  NA 141   < > 142  --   --   K 3.6   < > 3.1*   < > 4.2  CL 107   < > 113*  --   --   CO2 24   < > 23  --   --   GLUCOSE 105*   < > 88  --   --   BUN 19   < > 17  --   --  CREATININE 1.46*   < > 1.15*  --   --   CALCIUM 9.2   < > 8.3*  --   --   MG  --    < > 2.2  --   --   AST 22  --   --   --   --   ALT 9  --   --   --   --   ALKPHOS 72  --   --   --   --   BILITOT 0.9  --   --   --   --    < > = values in this interval not displayed.     Microbiology Results  Results for orders placed or performed during the hospital encounter of 05/09/18  Urine culture     Status: Abnormal   Collection Time: 05/09/18  2:51 PM  Result Value Ref Range Status   Specimen Description   Final    URINE, RANDOM Performed at [facility], [address], [place], [place] [address]    Special Requests   Final    NONE Performed at [facility], [address] [place], [place] [address]    Culture 10,000 COLONIES/mL YEAST (A)  Final    Report Status 05/11/2018 FINAL  Final    RADIOLOGY:  No results found.   Management plans discussed with the patient, family and they are in agreement.  CODE STATUS: Full Code   TOTAL TIME TAKING CARE OF THIS PATIENT: 40 minutes.    Jinny Blossom Gilmar Bua M.D on 05/14/2018 at 12:47 PM  Between 7am to 6pm - Pager [phone]  After 6pm go to [email or web address] - Scientist, research (life sciences) [facility]  Office  [phone]  CC: Primary care physician; [facility]   Note: This dictation was prepared with Nurse, children's dictation along with smaller phrase technology. Any transcriptional errors that result from this process are unintentional.

## 2018-05-14 NOTE — Progress Notes (Signed)
SLP Cancellation Note  Patient Details Name: Connie Beck MRN: [id number] DOB: [birth year]   Cancelled treatment:       Reason Eval/Treat Not Completed: Patient not medically ready(chart reviewed; consulted NSG). Discussed pt's status w/ NSG who reported pt was still exhibiting min+ oral phase deficits w/ the current pureed consistency diet c/b oral holding and slow A-P transfer. NSG reported pt drank liquids "easily". Suspect the oral phase dysphagia is impacted by pt's Cognitive decline - confusion was noted at home baseline per chart note at admission. Due to her current presentation, upgrading her food consistency would not be recommended at this time.  Recommend continue w/ current Dysphagia level 1 (PUREE) w/ Thin liquids diet w/ general aspiration precautions; pills in Puree. This may lessen exertion during meals thus allow for increased oral intake. Recommend f/u if any further decline in status. NSG agreed.   Jerilynn SomKatherine Watson, MS, CCC-SLP Watson,Katherine 05/14/2018, 1:36 PM

## 2018-05-14 NOTE — Clinical Social Work Note (Signed)
Patient is medically ready for discharge today. CSW notified patient's niece Marijo File [phone] of discharge to [facility] today. CSW also notified Inetta Fermo at [facility] of discharge today. Inetta Fermo states that they have received insurance authorization. Patient will be transported by EMS. RN to call report and call for transport.   [name] MSW, [address] [phone]

## 2018-09-25 ENCOUNTER — Other Ambulatory Visit: Payer: Self-pay

## 2018-09-25 ENCOUNTER — Encounter: Payer: Self-pay | Admitting: Emergency Medicine

## 2018-09-25 ENCOUNTER — Emergency Department
Admission: EM | Admit: 2018-09-25 | Discharge: 2018-10-01 | Disposition: A | Discharge status: Skilled Nursing Facility | Payer: Medicare Other | Attending: Emergency Medicine | Admitting: Emergency Medicine

## 2018-09-25 DIAGNOSIS — Z79899 Other long term (current) drug therapy: Secondary | ICD-10-CM | POA: Diagnosis not present

## 2018-09-25 DIAGNOSIS — Z20828 Contact with and (suspected) exposure to other viral communicable diseases: Secondary | ICD-10-CM | POA: Insufficient documentation

## 2018-09-25 DIAGNOSIS — R627 Adult failure to thrive: Secondary | ICD-10-CM | POA: Insufficient documentation

## 2018-09-25 DIAGNOSIS — I10 Essential (primary) hypertension: Secondary | ICD-10-CM | POA: Insufficient documentation

## 2018-09-25 DIAGNOSIS — R34 Anuria and oliguria: Secondary | ICD-10-CM | POA: Diagnosis present

## 2018-09-25 DIAGNOSIS — E860 Dehydration: Secondary | ICD-10-CM | POA: Insufficient documentation

## 2018-09-25 MED ORDER — SODIUM CHLORIDE 0.9 % IV BOLUS
1000.0000 mL | Freq: Once | INTRAVENOUS | Status: AC
  Administered 2018-09-25: 1000 mL via INTRAVENOUS

## 2018-09-25 NOTE — ED Notes (Signed)
IV team was unable to obtain labs - Provider Cuthriell [facility] aware and reports to not obtain labs at this time

## 2018-09-25 NOTE — ED Notes (Signed)
Called pt daughter in law Nevin Bloodgood to question which hospice the pt is affiliated with - she did not know this information without calling home and advised that she would return the call - Nevin Bloodgood was given information to return call to this nurse

## 2018-09-25 NOTE — ED Notes (Signed)
Attempted to I&O x3 by 3 nurses without success - provider notified IV and blood draw attempted x2 without success - provider notified - VO received for IV team consult with blood draw

## 2018-09-25 NOTE — ED Notes (Signed)
Attempted to obtain O2 sat via several different fingers, unable to get result. EDP aware.

## 2018-09-25 NOTE — ED Provider Notes (Signed)
[facility] Emergency Department Provider Note  ____________________________________________  Time seen: Approximately 12:31 PM  I have reviewed the triage vital signs and the nursing notes.   HISTORY  Chief Complaint Failure To Thrive  Level 5 caveat: Altered mental status, nonresponsive.  HPI Connie Beck is a 85 y.o. female who presents the emergency department via EMS from home.  Patient was sent to the emergency department for possible dehydration.  At baseline, patient is nonresponsive, has chronic contractures.   Patient is a hospice patient and has both in-house hospice care as well as a family caregiver.  Patient is unable to feed herself and drinks fluids, pured foods only.  Patient does have a DNR.  According to EMS, family and hospice RN was concerned as patient has not had a wet diaper in 2 days.  Patient has not been eating and drinking for several days.  According to EMS, family wanted patient transferred to hospice home, however hospice RN wanted patient evaluated in the emergency department for dehydration.  According to EMS, there is been no other complaints of shortness of breath, coughing, fevers, emesis, diarrhea or constipation.  Main complaint is failure to thrive with no eating or drinking, decreased urinary output.        Past Medical History:  Diagnosis Date  . Dizzy spells   . Hypertension     Patient Active Problem List   Diagnosis Date Noted  . Malnutrition of moderate degree 05/13/2018  . Altered mental status 05/09/2018  . Syncope 03/12/2018  . ARF (acute renal failure) ([place]) 02/11/2018    Past Surgical History:  Procedure Laterality Date  . FRACTURE SURGERY  2018   pelvis    Prior to Admission medications   Medication Sig Start Date End Date Taking? Authorizing Provider  escitalopram (LEXAPRO) 5 MG tablet Take 5 mg by mouth daily.   Yes [provider]  hyoscyamine (LEVSIN) 0.125 MG tablet Take 0.125 mg by  mouth every 4 (four) hours as needed.   Yes [provider]  LORazepam (ATIVAN) 0.5 MG tablet Take 0.5 mg by mouth every 4 (four) hours as needed for anxiety.   Yes [provider]  meclizine (ANTIVERT) 25 MG tablet Take 25 mg by mouth daily.   Yes [provider]  morphine 20 MG/5ML solution Take 10 mg by mouth every 2 (two) hours as needed for pain.   Yes [provider]  nitrofurantoin, macrocrystal-monohydrate, (MACROBID) 100 MG capsule Take 100 mg by mouth daily.   Yes [provider]    Allergies Asa [aspirin], Darvon [propoxyphene], Penicillins, and Sulfa antibiotics  Family History  Family history unknown: Yes    Social History Social History   Tobacco Use  . Smoking status: Never Smoker  . Smokeless tobacco: Never Used  Substance Use Topics  . Alcohol use: Never    Frequency: Never  . Drug use: Never     Review of Systems patient noncontributory. Constitutional: No reported fever/chills ENT: No upper respiratory complaints. Respiratory: no cough. No SOB. Gastrointestinal:  no vomiting.  No diarrhea.  No constipation. Genitourinary: No urinary output for 2 days Skin: Negative for rash, abrasions, lacerations, ecchymosis. Neurological: Negative for headaches, focal weakness or numbness. 10-point ROS otherwise negative.  ____________________________________________   PHYSICAL EXAM:  VITAL SIGNS: ED Triage Vitals  Enc Vitals Group     BP 09/25/18 1230 136/89     Pulse Rate 09/25/18 1230 88     Resp 09/25/18 1230 (!) 23  Temp 09/25/18 1230 99.1 F (37.3 C)     Temp Source 09/25/18 1230 Rectal     SpO2 09/25/18 1230 98 %     Weight 09/25/18 1221 99 lb 3.3 oz (45 kg)     Height 09/25/18 1221 5\' 1"  (1.549 m)     Head Circumference --      [facility] Flow --      Pain Score --      Pain Loc --      Pain Edu? --      Excl. in GC? --      Constitutional: Unresponsive.   no acute distress. Eyes: Conjunctivae are  normal. PERRL. Head: Atraumatic. ENT:      Ears:       Nose: No congestion/rhinnorhea.      Mouth/Throat: Mucous membranes are significantly dry and tongue is cracked. Neck: No stridor.   Hematological/Lymphatic/Immunilogical: No cervical lymphadenopathy. Cardiovascular: Normal rate, regular rhythm. Normal S1 and S2.  Good peripheral circulation. Respiratory: Normal respiratory effort without tachypnea or retractions. Lungs CTAB. Good air entry to the bases with no decreased or absent breath sounds. Gastrointestinal: Hypoactive bowel sounds 4 quadrants. Soft to palpation. No guarding or rigidity. No palpable masses. No distention.  Musculoskeletal: Upper extremities are contracted.  DTRs intact lower extremity but no active movement. Neurologic: Patient unresponsive. GCS 7 (2, 1, 4). Skin:  Skin is warm, dry and intact.  Scattered rash in skin folds consistent with candidal skin infection.  No other appreciable rash or findings concerning for cellulitis. Psychiatric: Unable to assess as patient is unresponsive.   ____________________________________________   LABS (all labs ordered are listed, but only abnormal results are displayed)  Labs Reviewed  URINE CULTURE  COMPREHENSIVE METABOLIC PANEL  TROPONIN I  CBC WITH DIFFERENTIAL/PLATELET  LACTIC ACID, PLASMA  LACTIC ACID, PLASMA  URINALYSIS, COMPLETE (UACMP) WITH MICROSCOPIC   ____________________________________________  EKG  ED ECG REPORT I, Delorise RoyalsJonathan D Raza Bayless,  personally viewed and interpreted this ECG.   Date: 09/25/2018  EKG Time: 1218 hrs.  Rate: 81 bpm  Rhythm: unchanged from previous tracings, normal sinus rhythm, left axis deviation  Axis: Left axis deviation  Intervals:none  ST&T Change: No ST elevation or depression noted  Normal sinus rhythm.  Left axis deviation.  Nonspecific T wave changes in the anterolateral leads.  Slightly prolonged  QTC.  ____________________________________________  RADIOLOGY   No results found.  ____________________________________________    PROCEDURES  Procedure(s) performed:    Procedures    Medications  sodium chloride 0.9 % bolus 1,000 mL (1,000 mLs Intravenous New Bag/Given 09/25/18 1427)     ____________________________________________   INITIAL IMPRESSION / ASSESSMENT AND PLAN / ED COURSE  Pertinent labs & imaging results that were available during my care of the patient were reviewed by me and considered in my medical decision making (see chart for details).  Review of the [place] CSRS was performed in accordance of the NCMB prior to dispensing any controlled drugs.  Clinical Course as of Sep 24 2121  Thu Sep 25, 2018  1408 Patient presents emergency department via EMS for decreased oral intake, no urinary output x2 days.  On exam, patient is unresponsive, GCS 7.  We have attempted multiple times to establish access for labs.  Finally able to establish IV access but unable to draw labs off of same.  Patient was cathed with no urine output.  At this time, I suspect that this is end-of-life transition.  I discussed the case with the on-call hospice  nurse after there was some confusion what long-term goals were for the patient.  Hospice advises that they feel that this is also end-of-life progression and recommend managing the patient with comfort care.  I discussed these findings with patient's family member.   [JC]  1650 I discussed the patient's progression with both hospitalist as well as the family member.  At this time, family understands that this is likely end-of-life progression for their family member.  At this time they request that patient be placed in hospice house.  Hospice service that the patient is being cared for and does not have any inpatient facility.  At this point, attempting to transfer hospice care to another hospice service for hospice house placement   [JC]     Clinical Course User Index [JC] Nastassja Witkop, Delorise RoyalsJonathan D, PA-C          Patient's diagnosis is consistent with failure to thrive, dehydration.  Patient was sent to the emergency department via EMS from home for concern for no oral intake of fluids or solids, no urinary output for 2 days.  Patient is in hospice service.  Apparently there was some confusion between hospice nurse and patient family.  Patient was sent here for evaluation.  I discussed the case with hospice service and they feel as I do that the patient is nearing the end of life.  We are unable to obtain blood work, urinalysis initially.  Patient was unresponsive, GCS of 7.  Clinically she appeared very dehydrated with dry mucous membranes, fissuring tongue.  With the inability to initially obtain labs, I discussed with both the family as well as hospice likelihood that this patient was nearing the end of life.  After discussion with both family and hospice, no further work-up, medications other than IV fluid will be attempted at this time.  Patient's current hospice does not have any inpatient care.  As such, hospice is attempting to relate Jae DireKate to another hospice home.  At this time, this process is still pending and patient is boarding in the emergency department.  Patient is a DNR.  When hospice is able to accept the patient for hospice house today, patient will be transferred to that location at that time.  Both family and hospice are aware that no further testing, medications (other than fluids) will be performed while in the emergency department.     ____________________________________________  FINAL CLINICAL IMPRESSION(S) / ED DIAGNOSES  Final diagnoses:  Adult failure to thrive  Dehydration      NEW MEDICATIONS STARTED DURING THIS VISIT:  ED Discharge Orders    None          This chart was dictated using voice recognition software/Dragon. Despite best efforts to proofread, errors can occur which can change  the meaning. Any change was purely unintentional.    Racheal PatchesCuthriell, Alliyah Roesler D, PA-C 09/25/18 2123    Shaune PollackIsaacs, [place], MD 09/26/18 [phone]

## 2018-09-25 NOTE — ED Notes (Signed)
Patient currently resting in bed with eyes closed. Will continue to monitor.

## 2018-09-26 LAB — SARS CORONAVIRUS 2 BY RT PCR (HOSPITAL ORDER, PERFORMED IN ~~LOC~~ HOSPITAL LAB): SARS Coronavirus 2: NEGATIVE

## 2018-09-26 MED ORDER — RESOURCE THICKENUP CLEAR PO POWD
ORAL | Status: DC | PRN
  Filled 2018-09-26: qty 125

## 2018-09-26 NOTE — TOC Progression Note (Signed)
Transition of Care [facility]) - Progression Note    Patient Details  Name: Morissa Obeirne MRN: [id number] Date of Birth: 01/19/1924  [facility]) CM/SW Contact  Shelbie Hutching, RN Phone Number: 09/26/2018, 4:10 PM  Clinical Narrative:    When RNCM spoke with patient's son Josph Macho and his wife Carlene they reported that they do want to know what happens with the patient, and they want her to be in a safe place.  Carlene would like to be updated.  Contact information added.    Expected Discharge Plan: (unknown at this time) Barriers to Discharge: Family Issues(DAughter in law will not take patient back home)  Expected Discharge Plan and Services Expected Discharge Plan: (unknown at this time) In-house Referral: Clinical Social Work Discharge Planning Services: CM Consult Post Acute Care Choice: Hospice Living arrangements for the past 2 months: Single Family Home                                       Social Determinants of Health (SDOH) Interventions    Readmission Risk Interventions No flowsheet data found.

## 2018-09-26 NOTE — TOC Progression Note (Signed)
Transition of Care [facility]) - Progression Note    Patient Details  Name: Cambrey Lupi MRN: [id number] Date of Birth: 1923/04/11  Transition of [facility]) CM/SW Contact  Shelbie Hutching, RN Phone Number: 09/26/2018, 3:36 PM  Clinical Narrative:    Patient is not hospice home appropriate.  Patient is sitting up in bed, alert, talking and requesting something to drink.  Patient answers questions and follows commands.  Patient has lived with her ex-daughter in law for the past 2 years, the patient's son is remarried and living in [place].  Patient's son name is Josph Macho, he has been contacted and updated on situation.  Josph Macho and his wife Carlene report that they are not responsible for any care of the patient and are not going to be responsible for any of her care.  Carlene reports that [place] the ex daughter in law has HCPOA.  RNCM has spoken with Nevin Bloodgood and explained that patient is not appropriate for hospice home and that the patient does not need to be in the emergency room and does not need to be admitted.  Nevin Bloodgood states "I thought she was going to a rest home".  RNCM explained to [place] that if she were to go to a rest home it would be an out of pocket expense.  Nevin Bloodgood reports that she cannot afford to pay out of pocket.  Nevin Bloodgood also will not take the patient back home.  RNCM explained to [place] that if she couldn't or wouldn't take the patient that APS would have to be notified.   Patient currently has services with [facility] hospice, RNCM has been in contact with [facility] but they have not provided a solution.  Patient is currently in the emergency department with no where to go.  [facility] reports that they cannot provide respite care without a discharge plan. RNCM has called APS and is waiting on return call to make report.    1604:  APS report has been made, being sent to [facility] [place] for review.      Expected Discharge Plan: (unknown at this time) Barriers to Discharge: Family Issues(DAughter in  law will not take patient back home)  Expected Discharge Plan and Services Expected Discharge Plan: (unknown at this time) In-house Referral: Clinical Social Work Discharge Planning Services: CM Consult Post Acute Care Choice: Hospice Living arrangements for the past 2 months: Single Family Home                                       Social Determinants of Health (SDOH) Interventions    Readmission Risk Interventions No flowsheet data found.

## 2018-09-26 NOTE — ED Notes (Signed)
LM for social work

## 2018-09-26 NOTE — ED Notes (Signed)
Checked pts brief. Pt is currently dry.

## 2018-09-26 NOTE — ED Notes (Signed)
Patient repositioned in bed.Brief changed. Skin care given.

## 2018-09-26 NOTE — TOC Initial Note (Signed)
Transition of Care [facility]) - Initial/Assessment Note    Patient Details  Name: Connie Beck MRN: [id number] Date of Birth: [birth year]  Transition of Care [facility]) CM/SW Contact:    Shelbie Hutching, RN Phone Number: 09/26/2018, 11:35 AM  Clinical Narrative:                 Patient was brought into the emergency room yesterday from home for dehydration.  Patient has hospice at home with [facility].  [facility] reports that patient's daughter in law, Nevin Bloodgood, requested that the patient come to the emergency room because she can no longer care for her at home.  Nevin Bloodgood reports that she has no intention of taking the patient back home and would like for her to be placed at the [facility].  Santiago Glad with [facility] has been contacted for hospice home referral.   Nevin Bloodgood is the patient's daughter in law, Nevin Bloodgood is no longer married to the patient's son but she has been caring for the patient for the past 2 years.  Per Nevin Bloodgood the patient's son is not very involved.  RNCM will cont to follow.   Expected Discharge Plan: [place] Barriers to Discharge: Barriers Unresolved (comment)(Hospice home referral pending)   Patient Goals and CMS Choice Patient states their goals for this hospitalization and ongoing recovery are:: Daughter in law Nevin Bloodgood would like for the patient to go to the hospice home. CMS Medicare.gov Compare Post Acute Care list provided to:: Patient Represenative (must comment) Choice offered to / list presented to : [place] / Guardian  Expected Discharge Plan and Services Expected Discharge Plan: [place] In-house Referral: Clinical Social Work Discharge Planning Services: CM Consult Post Acute Care Choice: Hospice Living arrangements for the past 2 months: [place]                                      Prior Living Arrangements/Services Living arrangements for the past 2 months: Single Family Home Lives with:: Relatives(daughter  in law) Patient language and need for interpreter reviewed:: No Do you feel safe going back to the place where you live?: No      Need for Family Participation in Patient Care: Yes (Comment)(hospice) Care giver support system in place?: Yes (comment)(daughter in law) Current home services: Hospice Criminal Activity/Legal Involvement Pertinent to Current Situation/Hospitalization: No - Comment as needed  Activities of Daily Living      Permission Sought/Granted Permission sought to share information with : Customer service manager, Tourist information centre manager, Other (comment) Permission granted to share information with : Yes, Verbal Permission Granted     Permission granted to share info w AGENCY: [place] and [facility]        Emotional Assessment Appearance:: Appears stated age Attitude/Demeanor/Rapport: Unable to Assess Affect (typically observed): Calm Orientation: : Oriented to Self Alcohol / Substance Use: Not Applicable Psych Involvement: No (comment)  Admission diagnosis:  failure to thrive Patient Active Problem List   Diagnosis Date Noted  . Malnutrition of moderate degree 05/13/2018  . Altered mental status 05/09/2018  . Syncope 03/12/2018  . ARF (acute renal failure) ([place]) 02/11/2018   PCP:  Orvis Brill, [place]:   [place] [address] [place]) [place] [address] Phone: [phone] Fax: [phone]     Social Determinants of Health (SDOH) Interventions  Readmission Risk Interventions No flowsheet data found.

## 2018-09-26 NOTE — ED Notes (Signed)
Pt family member at bedside, asking for update on hospice placement. Currently waiting on patient to be accepted with inpatient hospice.   Left message for social worker.

## 2018-09-26 NOTE — ED Notes (Signed)
This RN and Medical laboratory scientific officer in room to reposition pt. Pt brief changed.

## 2018-09-26 NOTE — ED Provider Notes (Signed)
-----------------------------------------   6:55 AM on 09/26/2018 -----------------------------------------  No events overnight.  Patient remains in the ED pending inpatient hospice care.   Paulette Blanch, MD 09/26/18 [phone]

## 2018-09-26 NOTE — ED Notes (Signed)
Pt ate x1 apple sauce cup and x1 thickened apple juice cup. Brief checked, not wet at this time.

## 2018-09-26 NOTE — Progress Notes (Addendum)
Notified by [facility] Doran Clay that patient's daughter in law  Nevin Bloodgood wished for patient to go to the Hospice home.Patient was brought to the [facility] ED on 6/19 for this purpose. Per discussion with Burnell Blanks is overwhelmed and unable to continue to care for patient. Patient is currently followed at home by [facility] hospice.  Patient seen lying in the ED stretcher, appeared to be resting comfortably, easily awakened to name and reported bing thirsty. Thickened liquids at bedside, head of bed raised and patient able to swallow spoonfuls of liquids. She was alert to self, and talked about her family, drifted to her past talking about her parents and her siblings. She also talked about her grandchildren. Patient continued to report being thirsty. She also reported being head of hearing and now having difficulty walking, she talked about having had a therapist help her stand, but she doesn't come any more. Patient at this time does not appear to be hospice home appropriate. In addition there is not currently a bed available. Writer spoke at length with both [facility] Pryor Montes and EDP Dr. Quentin Cornwall. Pryor Montes is continuing to reach out to [employer] and patient's family to work out safe discharge plan for this patient.  Flo Shanks BSN, RN, [facility] [phone]

## 2018-09-27 NOTE — ED Notes (Signed)
Pt fed thickened water and orange juice. Checked pts breif and pt was clean and dry at this time.

## 2018-09-27 NOTE — ED Notes (Signed)
Patient verbally upset/moaning/crying out not understand why she is here and not home. Attempted to explain to patient why she is here but patient cannot hear.

## 2018-09-27 NOTE — ED Notes (Signed)
PAtient fed water and orange juice

## 2018-09-27 NOTE — Social Work (Addendum)
1252: Social worker spoke to [place] worker who noted that case has been assigned and investigation is pending. Stated that "we have until Monday morning to process the case.  [place] social will check in with APS worker on Sunday AM.  APS worker contact number [phone].   Social work consult received.   0907: Social worker called [place] adult protective services for an update on APS report that was made on 09/26/2018, [phone]. Waiting for a call back.   As of note this patient does not meed criteria for Hospice Home. According to notes the patient currently receives home hospices w/[facility] however patient's current caregivers do not want her back in the home.    1. Patient cannot afford SNF/Nursing home.  2. Does not meet full criteria for Hospice Home 3. Family members/ex daughter in-law refusing to take patient back  4. Son that lives in [place] does not want to know anything about his mother's care.    0900: Social worker scrub the patient's chart. APS report was made on 09/26/2018.

## 2018-09-27 NOTE — ED Notes (Signed)
Patient fed food tray by [employer] and given water

## 2018-09-27 NOTE — ED Notes (Signed)
Patient fed food tray. Mostly just wanted liquid

## 2018-09-27 NOTE — ED Notes (Signed)
Patient soiled brief changed. PAtient cleaned, new brief placed, gown placed on patient. Patient turned with pillow facing right side. Avellyn placed on patients buttocks due to redness noted. PAtient given sips of water by spoon.

## 2018-09-27 NOTE — ED Notes (Signed)
Pt given sips of thickened water

## 2018-09-27 NOTE — ED Notes (Signed)
PT given sips of thickened water. PT not getting much in through straw, intake better when given by spoon. Peripheral pulses +2

## 2018-09-27 NOTE — ED Notes (Addendum)
Patients brief checked and dry. PAtient repositioned

## 2018-09-27 NOTE — ED Notes (Signed)
PAtient given water 

## 2018-09-27 NOTE — ED Notes (Signed)
Pt resting. Mask in place.

## 2018-09-27 NOTE — ED Notes (Signed)
Patients face washed by this RN

## 2018-09-27 NOTE — ED Notes (Signed)
Report given to Amber RN

## 2018-09-27 NOTE — ED Notes (Signed)
Pt. Placed in room #20 in hospital bed.

## 2018-09-28 NOTE — ED Provider Notes (Signed)
-----------------------------------------   3:21 AM on 09/28/2018 -----------------------------------------   Blood pressure 140/70, pulse 77, temperature 97.7 F (36.5 C), temperature source Oral, resp. rate 16, height 1.549 m (5\' 1" ), weight 45 kg, SpO2 100 %.   There have been no acute events since the last update.  Awaiting inpatient hospice placement.   Hinda Kehr, MD 09/28/18 [phone]

## 2018-09-28 NOTE — ED Notes (Signed)
BEHAVIORAL HEALTH ROUNDING Patient sleeping: Yes.   Patient alert and oriented: eyes closed  Appears asleep Behavior appropriate: Yes.  ; If no, describe:  Nutrition and fluids offered: Yes  Toileting and hygiene offered: sleeping Sitter present: q 15 minute observations and security monitoring Law enforcement present: yes  ODS 

## 2018-09-28 NOTE — ED Notes (Signed)
BEHAVIORAL HEALTH ROUNDING Patient sleeping: Yes.   Patient alert and oriented: eyes closed  Appears to be asleep Behavior appropriate: Yes.  ; If no, describe:  Nutrition and fluids offered: Yes  Toileting and hygiene offered: sleeping Sitter present: q 15 minute observations and security monitoring Law enforcement present: yes  ODS 

## 2018-09-28 NOTE — ED Notes (Signed)
Report to include situation, background, assessment and recommendations from [place]. Patient sleeping, respirations regular and unlabored. Q15 minute rounds and security officer observation to continue.

## 2018-09-28 NOTE — ED Notes (Signed)
Pt. Undergarment changed, pt. Adjusted in bed to feel more comfortable.

## 2018-09-28 NOTE — ED Notes (Signed)
Pt fed breakfast tray by this EDT. Pt ate about 20% of tray.

## 2018-09-28 NOTE — ED Notes (Signed)
Pt ate 3oz. Of applesauce, 1 oz. Of orange ice cream, and 3 oz. Of water. Patient said she was full and didn't want to eat anymore. She has had vitals completed after lunch by RN Amy.

## 2018-09-28 NOTE — ED Notes (Signed)
ED  Is the patient under IVC or is there intent for IVC: no  She is awaiting placement   Is the patient medically cleared: Yes.   Is there vacancy in the ED BHU: Yes.   Is the population mix appropriate for patient: geriatric    Is the patient awaiting placement in inpatient or outpatient setting:  Placement pending Has the patient had a psychiatric consult:  No    Survey of unit performed for contraband, proper placement and condition of furniture, tampering with fixtures in bathroom, shower, and each patient room: Yes.  ; Findings:  APPEARANCE/BEHAVIOR Calm and cooperative NEURO ASSESSMENT Orientation: oriented to self    Hallucinations: No.None noted (Hallucinations)  Speech: Normal Gait: unsteady  - assistance to be provided  RESPIRATORY ASSESSMENT Even  Unlabored respirations  CARDIOVASCULAR ASSESSMENT Pulses equal   regular rate  Skin warm and dry   GASTROINTESTINAL ASSESSMENT no GI complaint EXTREMITIES Full ROM  PLAN OF CARE Provide calm/safe environment. Vital signs assessed twice daily. ED BHU Assessment once each 12-hour shift. Collaborate with TTS if available  Assure the ED provider has rounded once each shift. Provide and encourage hygiene. Provide redirection as needed. Assess for escalating behavior; address immediately and inform ED provider.  Assess family dynamic and appropriateness for visitation as needed: Yes.  ; If necessary, describe findings:  Educate the patient/family about BHU procedures/visitation: Yes.  ; If necessary, describe findings:

## 2018-09-28 NOTE — ED Notes (Signed)
Hourly rounding reveals patient sleeping in room. No complaints, stable, in no acute distress. Q15 minute rounds and monitoring via Rover and Officer to continue.  

## 2018-09-28 NOTE — ED Notes (Signed)
Awake and alert - oriented to self  Attempt made to orient her to place, time and situation  Pt states  "I am afraid" - multiple times during assessment  She denies pain  Water thickened and provided   Repositioned

## 2018-09-28 NOTE — ED Notes (Signed)
Pt drank over half cup of soup and 1/3 cup of tea.  Pt checked for dryness, will recheck before shift change   Lm edt

## 2018-09-28 NOTE — ED Notes (Signed)
Checked pt's brief; dry currently.

## 2018-09-28 NOTE — ED Notes (Signed)
Pt. Requesting water, pt. Given thickened water.

## 2018-09-28 NOTE — ED Notes (Signed)
BEHAVIORAL HEALTH ROUNDING Patient sleeping: No. Patient alert and oriented: yes Behavior appropriate: Yes.  ; If no, describe:  Nutrition and fluids offered: yes Toileting and hygiene offered: Yes  Sitter present: q15 minute observations and security  monitoring Law enforcement present: Yes  ODS  

## 2018-09-28 NOTE — ED Notes (Signed)
Pt. Given some thickened water, pt. Drank about 4 oz and indicated that was enough.  Pt. Requesting room light on, room light kept on.

## 2018-09-28 NOTE — ED Notes (Signed)

## 2018-09-29 LAB — CBC WITH DIFFERENTIAL/PLATELET
Abs Immature Granulocytes: 0.04 10*3/uL (ref 0.00–0.07)
Basophils Absolute: 0 10*3/uL (ref 0.0–0.1)
Basophils Relative: 0 %
Eosinophils Absolute: 0.3 10*3/uL (ref 0.0–0.5)
Eosinophils Relative: 4 %
HCT: 31.7 % — ABNORMAL LOW (ref 36.0–46.0)
Hemoglobin: 10.6 g/dL — ABNORMAL LOW (ref 12.0–15.0)
Immature Granulocytes: 1 %
Lymphocytes Relative: 31 %
Lymphs Abs: 2.3 10*3/uL (ref 0.7–4.0)
MCH: 31.7 pg (ref 26.0–34.0)
MCHC: 33.4 g/dL (ref 30.0–36.0)
MCV: 94.9 fL (ref 80.0–100.0)
Monocytes Absolute: 0.4 10*3/uL (ref 0.1–1.0)
Monocytes Relative: 6 %
Neutro Abs: 4.4 10*3/uL (ref 1.7–7.7)
Neutrophils Relative %: 58 %
Platelets: 209 10*3/uL (ref 150–400)
RBC: 3.34 MIL/uL — ABNORMAL LOW (ref 3.87–5.11)
RDW: 13.8 % (ref 11.5–15.5)
WBC: 7.5 10*3/uL (ref 4.0–10.5)
nRBC: 0 % (ref 0.0–0.2)

## 2018-09-29 LAB — BASIC METABOLIC PANEL
Anion gap: 7 (ref 5–15)
BUN: 18 mg/dL (ref 8–23)
CO2: 24 mmol/L (ref 22–32)
Calcium: 8.4 mg/dL — ABNORMAL LOW (ref 8.9–10.3)
Chloride: 108 mmol/L (ref 98–111)
Creatinine, Ser: 1.1 mg/dL — ABNORMAL HIGH (ref 0.44–1.00)
GFR calc Af Amer: 50 mL/min — ABNORMAL LOW (ref >60)
GFR calc non Af Amer: 43 mL/min — ABNORMAL LOW (ref >60)
Glucose, Bld: 110 mg/dL — ABNORMAL HIGH (ref 70–99)
Potassium: 2.8 mmol/L — ABNORMAL LOW (ref 3.5–5.1)
Sodium: 139 mmol/L (ref 135–145)

## 2018-09-29 MED ORDER — ESCITALOPRAM OXALATE 10 MG PO TABS
5.0000 mg | ORAL_TABLET | Freq: Every day | ORAL | Status: DC
  Administered 2018-09-29 – 2018-10-01 (×3): 5 mg via ORAL
  Filled 2018-09-29 (×3): qty 1

## 2018-09-29 MED ORDER — POTASSIUM CHLORIDE CRYS ER 20 MEQ PO TBCR
40.0000 meq | EXTENDED_RELEASE_TABLET | Freq: Once | ORAL | Status: AC
  Administered 2018-09-29: 40 meq via ORAL
  Filled 2018-09-29: qty 2

## 2018-09-29 MED ORDER — HYOSCYAMINE SULFATE 0.125 MG PO TBDP
0.1250 mg | ORAL_TABLET | ORAL | Status: DC | PRN
  Administered 2018-09-29: 0.125 mg via ORAL
  Filled 2018-09-29 (×2): qty 1

## 2018-09-29 MED ORDER — POTASSIUM CHLORIDE CRYS ER 20 MEQ PO TBCR: 40.0000 meq | EXTENDED_RELEASE_TABLET | Freq: Two times a day (BID) | ORAL | Status: DC

## 2018-09-29 MED ORDER — POTASSIUM CHLORIDE CRYS ER 20 MEQ PO TBCR
20.0000 meq | EXTENDED_RELEASE_TABLET | Freq: Two times a day (BID) | ORAL | Status: DC
  Administered 2018-09-29 – 2018-09-30 (×2): 20 meq via ORAL
  Filled 2018-09-29 (×2): qty 1

## 2018-09-29 NOTE — ED Notes (Signed)
Hourly rounding reveals patient sleeping in room. No complaints, stable, in no acute distress. Q15 minute rounds and monitoring via Rover and Officer to continue.  

## 2018-09-29 NOTE — ED Notes (Signed)
PT and OT in room with Patient, Patient is pleasant, calm and cooperative.

## 2018-09-29 NOTE — ED Provider Notes (Addendum)
-----------------------------------------   10:14 AM on 09/29/2018 -----------------------------------------  Blood pressure 131/65, pulse 77, temperature 98.7 F (37.1 C), temperature source Oral, resp. rate 16, height 5\' 1"  (1.549 m), weight 45 kg, SpO2 97 %.  The patient is calm and cooperative at this time.  Of note, reviewed her ED course and patient was denied for hospice on 6/19.  Case management is involved.  Patient will be seen by PT, OT, for possible placement.  Have sent screening labs.    Duffy Bruce, MD 09/29/18 1013    Duffy Bruce, MD 09/29/18 1014

## 2018-09-29 NOTE — ED Notes (Signed)
Nurse called family member Nevin Bloodgood) and let Patient talk on the phone to her, Patient had coherent conversation with her, Patient talked to nurse regarding her family and said she cries often because her son died when He was 69, and she misses him, Nurse will continue to monitor.

## 2018-09-29 NOTE — TOC Progression Note (Addendum)
Transition of Care [facility]) - Progression Note    Patient Details  Name: Connie Beck MRN: [id number] Date of Birth: 09-Nov-1923  Transition of Care [facility]) CM/SW Contact  Marshell Garfinkel, RN Phone Number: 09/29/2018, 11:04 AM  Clinical Narrative:    Received call from patient's daughter in law Matty Deamer Nevin (patient's son Girard Cooter current wife) [phone] (sounded like she was crying) wanting update on patient. Kaleena Corrow Nevin did not have anything new to offer when I asked if she would be willing to take patient in-She states that Nevin Bloodgood has health care proxy and text her saying that "patient is dying". EDRNCM updated Juandedios Dudash Nevin that "I was not aware of this information and that APS was in with patient now".   [facility] kia [phone] also requesting update. Update at 1132: This RNCM, EDCSW, and APS worker Event organiser met with patient. Patient kept saying "don't put it in the paper; I just found out about it recently and I don't want my family to be in trouble". Scott took assessment; per ED RN it was reported that patient skin was well kept with no signs of bruising, scrapes, etc.    Expected Discharge Plan: (unknown at this time) Barriers to Discharge: Family Issues(DAughter in law will not take patient back home)  Expected Discharge Plan and Services Expected Discharge Plan: (unknown at this time) In-house Referral: Clinical Social Work Discharge Planning Services: CM Consult Post Acute Care Choice: Hospice Living arrangements for the past 2 months: Single Family Home                                       Social Determinants of Health (SDOH) Interventions    Readmission Risk Interventions No flowsheet data found.

## 2018-09-29 NOTE — TOC Progression Note (Addendum)
Transition of Care [facility]) - Progression Note    Patient Details  Name: Flonnie Wierman MRN: [id number] Date of Birth: 1923/10/11  [facility]) CM/SW Contact  Marshell Garfinkel, RN Phone Number: 09/29/2018, 8:39 AM  Clinical Narrative:    Waiting for update with APS worker contact number [phone]. Patient open to [facility] hospice however pending placement. PT/OT requested from RN. RNCM spoke with APS weekend/after hours case worker Apolonio Schneiders- she asked that I reach out to [phone] [employer].at 1000A: Nicki Reaper will be here in 30 minutes to assess patient. THis RNCM confirmed with hospice liaison Santiago Glad that patient is not appropriate for inpatient hospice- EDP updated.  Expected Discharge Plan: (unknown at this time) Barriers to Discharge: Family Issues(DAughter in law will not take patient back home)  Expected Discharge Plan and Services Expected Discharge Plan: (unknown at this time) In-house Referral: Clinical Social Work Discharge Planning Services: CM Consult Post Acute Care Choice: Hospice Living arrangements for the past 2 months: Single Family Home                                       Social Determinants of Health (SDOH) Interventions    Readmission Risk Interventions No flowsheet data found.

## 2018-09-29 NOTE — ED Notes (Signed)
Diaper dry.

## 2018-09-29 NOTE — Evaluation (Signed)
Physical Therapy Evaluation Patient Details Name: Connie Beck MRN: [id number] DOB: 08-Sep-1923 Today's Date: 09/29/2018   History of Present Illness  Connie Beck is a 85 y.o. female who presents the emergency department via EMS from home.  Patient was sent to the emergency department for possible dehydration.  Patient is a hospice patient and has both in-house hospice care as well as a family caregiver.  Patient is unable to feed herself and drinks fluids, pured foods only.  Patient does have a DNR.  According to EMS, family and hospice RN was concerned as patient has not had a wet diaper in 2 days.  Patient has not been eating and drinking for several days.   According to EMS, there is been no other complaints of shortness of breath, coughing, fevers, emesis, diarrhea or constipation.  Main complaint is failure to thrive with no eating or drinking, decreased urinary output.  PMH includes dizzy spells, htn, ARF, AMS, syncope, chronic compression fx L2, h/o pelvis fx surgery  Clinical Impression  PT/OT co-eval.  Prior to hospital admission, pt reports difficulty with functional mobility (but unable to specify prior mobility levels).  Pt lives with her ex daughter in law.  Currently pt requires 1-2 assist with bed mobility and 2 assist to come to partial stand.  Pt able to sit onto edge of bed for about 10 minutes with SBA but tends to lean onto L elbow.  Pt would benefit from skilled PT to address noted impairments and functional limitations (see below for any additional details).  Upon hospital discharge, pt would benefit from STR trial if pt/family agreeable; otherwise recommend continuing home hospice care with 24 hour supervision/assist.    Follow Up Recommendations SNF(trial)    Equipment Recommendations  Wheelchair (measurements PT);Wheelchair cushion (measurements PT);3in1 (PT);Hospital bed(hoyer lift)    Recommendations for Other Services       Precautions / Restrictions  Precautions Precautions: Fall Restrictions Weight Bearing Restrictions: No      Mobility  Bed Mobility Overal bed mobility: Needs Assistance Bed Mobility: Supine to Sit;Rolling Rolling: Mod assist(R and L in bed)   Supine to sit: Max assist;HOB elevated;+2 for physical assistance     General bed mobility comments: assist for trunk and B LE's semi-supine to/from sit (max assist semi-supine to sit; 2 assist sit to supine)  Transfers Overall transfer level: Needs assistance Equipment used: 2 person hand held assist Transfers: Sit to/from Stand Sit to Stand: Mod assist;Max assist;+2 physical assistance         General transfer comment: assist to come into half-stand with mod to max assist x2  Ambulation/Gait             General Gait Details: not appropriate at this time  Stairs            Wheelchair Mobility    Modified Rankin (Stroke Patients Only)       Balance Overall balance assessment: Needs assistance Sitting-balance support: Single extremity supported;Feet supported Sitting balance-Leahy Scale: Fair Sitting balance - Comments: pt SBA sitting edge of bed; tends to lean onto L elbow while sitting in bed Postural control: Left lateral lean Standing balance support: Bilateral upper extremity supported Standing balance-Leahy Scale: Zero Standing balance comment: Pt unable to come to full stand.                             Pertinent Vitals/Pain Pain Assessment: Faces Faces Pain Scale: Hurts a little bit Pain Location:  none specified Pain Descriptors / Indicators: Guarding;Grimacing Pain Intervention(s): Limited activity within patient's tolerance;Monitored during session;Repositioned  Vitals (HR and O2 on room air) stable and WFL throughout treatment session.    Home Living Family/patient expects to be discharged to:: Private residence Living Arrangements: Children(Lives with ex daughter in law x2 years.  Son lives in  [place].) Available Help at Discharge: Available 24 hours/day;Family Type of Home: House Home Access: Ramped entrance     [place]: One level Home Equipment: Environmental consultant - 2 wheels;Walker - 4 wheels;Bedside commode;Wheelchair - manual;Hospital bed;Shower seat Additional Comments: Pt confused, unable to give PLOF at time of this evaluation. Information gathered from chart review as pt had prior admission in 05/2018 at that admission family endorsed that pt has a variety of home equipment including a [facility] bed, lift (type not indicated), and shower seat.    Prior Function Level of Independence: Needs assistance   Gait / Transfers Assistance Needed: Per therapy note about 4 months ago: "Per family, pt not ambulating much. Completes ADLs at bed level. "  ADL's / Homemaking Assistance Needed: Per therapy noted about 4 months ago "Assist with bathing, dressing.  Family does the driving, cooking, cleaning."        Hand Dominance   Dominant Hand: Right    Extremity/Trunk Assessment   Upper Extremity Assessment Upper Extremity Assessment: Generalized weakness    Lower Extremity Assessment Lower Extremity Assessment: Generalized weakness    Cervical / Trunk Assessment Cervical / Trunk Assessment: Kyphotic  Communication   Communication: HOH  Cognition Arousal/Alertness: Awake/alert Behavior During Therapy: Anxious Overall Cognitive Status: No family/caregiver present to determine baseline cognitive functioning                                 General Comments: Pt perseverating during session regarding if her family is "alive or dead" and requiring consistent re-direction for tasks.      General Comments General comments (skin integrity, edema, etc.): Pt SPo2 stable at 96 after activity. HR 87.    Exercises    Assessment/Plan    PT Assessment Patient needs continued PT services  PT Problem List Decreased strength;Decreased activity tolerance;Decreased  balance;Decreased mobility;Decreased knowledge of use of DME       PT Treatment Interventions DME instruction;Functional mobility training;Therapeutic activities;Therapeutic exercise;Balance training;Patient/family education    PT Goals (Current goals can be found in the Care Plan section)  Acute Rehab PT Goals Patient Stated Goal: to improve strength PT Goal Formulation: With patient Time For Goal Achievement: 10/14/18 Potential to Achieve Goals: Fair    Frequency Min 2X/week   Barriers to discharge Decreased caregiver support      Co-evaluation PT/OT/SLP Co-Evaluation/Treatment: Yes Reason for Co-Treatment: To address functional/ADL transfers PT goals addressed during session: Mobility/safety with mobility;Balance;Strengthening/ROM OT goals addressed during session: ADL's and self-care;Strengthening/ROM       AM-PAC PT "6 Clicks" Mobility  Outcome Measure Help needed turning from your back to your side while in a flat bed without using bedrails?: A Lot Help needed moving from lying on your back to sitting on the side of a flat bed without using bedrails?: A Lot Help needed moving to and from a bed to a chair (including a wheelchair)?: A Lot Help needed standing up from a chair using your arms (e.g., wheelchair or bedside chair)?: A Lot Help needed to walk in hospital room?: Total Help needed climbing 3-5 steps with a railing? :  Total 6 Click Score: 10    End of Session Equipment Utilized During Treatment: Gait belt Activity Tolerance: Patient limited by fatigue Patient left: in bed(nurse present to change pt's brief) Nurse Communication: Mobility status;Precautions PT Visit Diagnosis: Other abnormalities of gait and mobility (R26.89);Muscle weakness (generalized) (M62.81)    Time: [id number] PT Time Calculation (min) (ACUTE ONLY): 28 min   Charges:   PT Evaluation $PT Eval Low Complexity: 1 Low PT Treatments $Therapeutic Exercise: 8-22 mins       Hendricks LimesEmily  Kathan Kirker, PT 09/29/18, 4:39 PM [phone]

## 2018-09-29 NOTE — ED Notes (Signed)
VOL  PENDING  PLACEMENT 

## 2018-09-29 NOTE — ED Provider Notes (Signed)
-----------------------------------------   7:04 AM on 09/29/2018 -----------------------------------------   Blood pressure 131/65, pulse 77, temperature 98.7 F (37.1 C), temperature source Oral, resp. rate 16, height 5\' 1"  (1.549 m), weight 45 kg, SpO2 97 %.  The patient had no acute events since last update.  Calm and cooperative at this time.  Disposition is pending per Psychiatry/Behavioral Medicine team recommendations.     Alfred Levins, [place], MD 09/29/18 [phone]

## 2018-09-29 NOTE — ED Notes (Signed)
Nurse feed Patient some bites of icecream and mashed potatoes, she swallowed soft foods without difficulty, she also had thickened water to drink.

## 2018-09-29 NOTE — ED Notes (Signed)
Report to include situation, background, assessment and recommendations from [facility]. Patient sleeping, respirations regular and unlabored. Rover, Q15 minute rounds and security observation to continue.

## 2018-09-29 NOTE — ED Notes (Signed)
Diaper dry. Patient repositioned.

## 2018-09-29 NOTE — ED Notes (Signed)
Pt ate 4 oz of grits, and had 4 oz of water. Pt stated after that she was full and didn't want anymore.

## 2018-09-29 NOTE — Evaluation (Signed)
Occupational Therapy Evaluation Patient Details Name: Connie Beck MRN: [id number] DOB: [birth year] Today's Date: 09/29/2018    History of Present Illness Connie Beck is a 85 y.o. female who presents the emergency department via EMS from home.  Patient was sent to the emergency department for possible dehydration.  Patient is a hospice patient and has both in-house hospice care as well as a family caregiver.  Patient is unable to feed herself and drinks fluids, pured foods only.  Patient does have a DNR.  According to EMS, family and hospice RN was concerned as patient has not had a wet diaper in 2 days.  Patient has not been eating and drinking for several days.   According to EMS, there is been no other complaints of shortness of breath, coughing, fevers, emesis, diarrhea or constipation.  Main complaint is failure to thrive with no eating or drinking, decreased urinary output.   Clinical Impression   Pt seen in ED for OT/PT co-evaluation this date. Pt confused/agitated at time of evaluation. Upon arrival to room pt supine in bed moaning and crying out. Chart review indicates pt was dependent for transfers and ADLs at baseline. Pt agreeable to seeing OT/PT on this date. Currently pt demonstrates impairments in cognition, activity tolerance, UE strength and functional use requiring mod/max assist for bed level ADLs and +2 mod/max assist for functional transfers. Pt was able to sit EOB to complete self-feeding on this date. Pt would benefit from skilled OT to address noted impairments and functional limitations (see below for any additional details) in order to maximize safety and independence while minimizing falls risk and caregiver burden. Upon hospital discharge, pt would benefit from STR trial if family/patient agreeable, otherwise continue home hospice care with 24/hr supervision and assistance for ADLs.      Follow Up Recommendations  Supervision/Assistance - 24 hour;SNF(Trial SNF)    Equipment  Recommendations  Other (comment)(TBD)    Recommendations for Other Services       Precautions / Restrictions Precautions Precautions: Fall Restrictions Weight Bearing Restrictions: No      Mobility Bed Mobility Overal bed mobility: Needs Assistance Bed Mobility: Supine to Sit     Supine to sit: Max assist;HOB elevated        Transfers Overall transfer level: Needs assistance Equipment used: 2 person hand held assist Transfers: Sit to/from Stand Sit to Stand: +2 physical assistance;Max assist;Mod assist         General transfer comment: Pt able to come to half-stand with +2 mod/max assist x1 on this date. Pt unable to come to full standing.  Functional mobility imited by activity tolerance as well as cognitive status on this date.    Balance Overall balance assessment: Needs assistance Sitting-balance support: Single extremity supported;Feet supported Sitting balance-Leahy Scale: Fair Sitting balance - Comments: Pt able to sit EOB during functional activity. Required at least CGA to mod assist to maintain static sitting balance. Tended to lean onto left elbow while sitting in bed. Also required consistent cueing to activiate abdominals to come to full sit EOB. Postural control: Left lateral lean Standing balance support: Bilateral upper extremity supported Standing balance-Leahy Scale: Zero Standing balance comment: Pt unable to come to full stand.                           ADL either performed or assessed with clinical judgement   ADL Overall ADL's : Needs assistance/impaired Eating/Feeding: Set up;Sitting;Minimal assistance Eating/Feeding Details (indicate cue type  and reason): Pt able to sit EOB to take 2-3 bites of food from tray with her right hand. Pt on pureed diet. Able to hold cup to drink nectar thick tea from straw. Would likely require min/mod assist to self-feed full meal as pt fatigues quickly. Grooming: Sitting;Set up;Moderate assistance    Upper Body Bathing: Sitting;Set up;Maximal assistance   Lower Body Bathing: Sitting/lateral leans;Maximal assistance   Upper Body Dressing : Sitting;Set up;Moderate assistance   Lower Body Dressing: Sitting/lateral leans;Maximal assistance;Set up   Toilet Transfer: +2 for safety/equipment;+2 for physical assistance;BSC;Stand-pivot                   Vision   Additional Comments: Pt unable to state.     Perception     Praxis      Pertinent Vitals/Pain Pain Assessment: Faces Faces Pain Scale: Hurts a little bit Pain Descriptors / Indicators: Guarding;Grimacing Pain Intervention(s): Limited activity within patient's tolerance;Monitored during session;Repositioned     Hand Dominance Right   Extremity/Trunk Assessment Upper Extremity Assessment Upper Extremity Assessment: Generalized weakness   Lower Extremity Assessment Lower Extremity Assessment: Generalized weakness       Communication Communication Communication: HOH   Cognition Arousal/Alertness: Awake/alert Behavior During Therapy: Agitated;Anxious Overall Cognitive Status: Impaired/Different from baseline                                 General Comments: Pt agitated t/o evaluation. At times stating "I don't know if my family is alive or dead" and asking OT/PT to tell her if her family is alive. Required consistent re-direction to tasks.   General Comments  Pt SPo2 stable at 96 after activity. HR 87.    Exercises Other Exercises Other Exercises: Pt assisted with bed mobility and attempted transfers on this date. Required +2 mod./max assist. While seated at EOB pt engaged in functional activity to assess sitting balance and functional UE use. OT provided set-up and min assist for self feeding on this date.   Shoulder Instructions      Home Living Family/patient expects to be discharged to:: Private residence Living Arrangements: Children(Lives with ex D[place]. [place] Available  Help at Discharge: Available 24 hours/day;Family Type of Home: House Home Access: Ramped entrance     Home Layout: One level         Bathroom Toilet: Standard     Home Equipment: Environmental consultantWalker - 2 wheels;Walker - 4 wheels;Bedside commode;Wheelchair - manual;Hospital bed;Shower seat   Additional Comments: Pt confused, unable to give PLOF at time of this evaluation. Information gathered from chart review as pt had prior admission in 05/2018 at that admission family endorsed that pt has a variety of home equipment including a BSC, Hospital bed, lift (type not indicated), and shower seat.      Prior Functioning/Environment                   OT Problem List: Decreased strength;Decreased coordination;Decreased range of motion;Decreased cognition;Decreased activity tolerance;Decreased safety awareness;Impaired balance (sitting and/or standing);Decreased knowledge of use of DME or AE;Impaired UE functional use      OT Treatment/Interventions: Self-care/ADL training;Therapeutic exercise;Modalities;Balance training;Therapeutic activities;Energy conservation;Cognitive remediation/compensation;DME and/or AE instruction;Patient/family education    OT Goals(Current goals can be found in the care plan section) Acute Rehab OT Goals OT Goal Formulation: Patient unable to participate in goal setting ADL Goals Pt Will Perform Eating: sitting;with min assist(With LRAD PRN for improved safety and functional  independence.) Pt Will Perform Grooming: sitting;with min assist(With LRAD PRN for improved safety and functional independence) Pt Will Perform Upper Body Bathing: with mod assist;sitting;with set-up(With LRAD PRN for improved safety and functional independence)  OT Frequency: Min 1X/week   Barriers to D/C:            Co-evaluation PT/OT/SLP Co-Evaluation/Treatment: Yes Reason for Co-Treatment: Complexity of the patient's impairments (multi-system involvement);Necessary to address  cognition/behavior during functional activity;To address functional/ADL transfers PT goals addressed during session: Mobility/safety with mobility;Balance;Strengthening/ROM OT goals addressed during session: ADL's and self-care;Strengthening/ROM      AM-PAC OT "6 Clicks" Daily Activity     Outcome Measure Help from another person eating meals?: A Little Help from another person taking care of personal grooming?: A Lot Help from another person toileting, which includes using toliet, bedpan, or urinal?: A Lot Help from another person bathing (including washing, rinsing, drying)?: A Lot Help from another person to put on and taking off regular upper body clothing?: A Lot Help from another person to put on and taking off regular lower body clothing?: A Lot 6 Click Score: 13   End of Session Equipment Utilized During Treatment: Gait belt  Activity Tolerance: Patient tolerated treatment well Patient left: in bed;with call bell/phone within reach;with nursing/sitter in room(With RN in room to administer medications.)  OT Visit Diagnosis: Other abnormalities of gait and mobility (R26.89);Muscle weakness (generalized) (M62.81);Other symptoms and signs involving cognitive function                Time: 1430-1458 OT Time Calculation (min): 28 min Charges:  OT General Charges $OT Visit: 1 Visit OT Evaluation $OT Eval Moderate Complexity: 1 Mod OT Treatments $Self Care/Home Management : 8-22 mins  Shara Blazing, M.S., OTR/L Ascom: [phone] 09/29/18, 4:05 PM

## 2018-09-30 MED ORDER — POTASSIUM CHLORIDE 20 MEQ PO PACK
20.0000 meq | PACK | Freq: Once | ORAL | Status: AC
  Administered 2018-09-30: 20 meq via ORAL
  Filled 2018-09-30: qty 1

## 2018-09-30 NOTE — ED Notes (Signed)
BEHAVIORAL HEALTH ROUNDING Patient sleeping: No. Patient alert : yes Behavior appropriate: Yes.  ; If no, describe:  Nutrition and fluids offered: yes Toileting and hygiene offered: Yes  Sitter present: q15 minute observations and security monitoring Law enforcement present: Yes  ODS  

## 2018-09-30 NOTE — ED Notes (Signed)
Patient observed lying in bed with eyes closed  Even, unlabored respirations observed   NAD   I will continue to monitor along with every 15 minute visual observations

## 2018-09-30 NOTE — ED Notes (Signed)
Hourly rounding reveals patient sleeping in room. No complaints, stable, in no acute distress. Q15 minute rounds and monitoring via Rover and Officer to continue.  

## 2018-09-30 NOTE — ED Notes (Signed)

## 2018-09-30 NOTE — ED Notes (Signed)
BEHAVIORAL HEALTH ROUNDING Patient sleeping: No. Patient alert and oriented: yes Behavior appropriate: Yes.  ; If no, describe:  Nutrition and fluids offered: yes Toileting and hygiene offered: Yes  Sitter present: q15 minute observations and security  monitoring Law enforcement present: Yes  ODS  

## 2018-09-30 NOTE — ED Notes (Signed)
She was fed approx 1/3rd of her lunch and she stated she was full - she then ate 1/2 of a magic cup  She drank a juice and 135ml water   Repositioned and brief checked

## 2018-09-30 NOTE — TOC Progression Note (Signed)
Transition of Care [facility]) - Progression Note    Patient Details  Name: Connie Beck MRN: [id number] Date of Birth: [birth year]  Transition [facility]) CM/SW Contact  Marshell Garfinkel, RN Phone Number: 09/30/2018, 9:39 AM  Clinical Narrative:     Amy with [facility] hosp[facility] states that they admitted patient to hospice under Senile Dementia.  Expected Discharge Plan: (unknown at this time) Barriers to Discharge: Family Issues(DAughter in law will not take patient back home)  Expected Discharge Plan and Services Expected Discharge Plan: (unknown at this time) In-house Referral: Clinical Social Work Discharge Planning Services: CM Consult Post Acute Care Choice: Hospice Living arrangements for the past 2 months: Single Family Home                                       Social Determinants of Health (SDOH) Interventions    Readmission Risk Interventions No flowsheet data found.

## 2018-09-30 NOTE — Progress Notes (Signed)
Palliative referral received for goals of care. After chart review it seems patient is not a candidate for inpatient hospice per previous evaluations and notations by hospice team. Social Work involved with disposition and working with APS for support.   If patient discharges to SNF would recommend continuing with outpatient palliative for support.   Thanks for your referral. At this time no needs for palliative and our team will sign-off.   Alda Lea, AGPCNP-BC Palliative Medicine Team  Phone: [phone] Fax: [phone] Pager: [phone] Amion: N. Cousar   NO CHARGE

## 2018-09-30 NOTE — TOC Progression Note (Addendum)
Transition of Care [facility]) - Progression Note    Patient Details  Name: Connie Beck MRN: [id number] Date of Birth: June 14, 1923  Transition of Care [facility]) CM/SW Contact  Tania Lorelei Heikkila, LCSW Phone Number: 09/30/2018, 9:27 AM  Clinical Narrative:    9:25am - CSW began bed search for SNF placement.   PASRR #  [id number]   9:49am - [facility] may be able to accept patient. Will review her chart, and she may need updated COVID-19 test.   1:13pm [facility] will call DSS to see where they are in the Medicaid process, if patient will have to be accepted for long-term care instead of SNF.    Expected Discharge Plan: (unknown at this time) Barriers to Discharge: Family Issues(DAughter in law will not take patient back home)  Expected Discharge Plan and Services Expected Discharge Plan: (unknown at this time) In-house Referral: Clinical Social Work Discharge Planning Services: CM Consult Post Acute Care Choice: Hospice Living arrangements for the past 2 months: Single Family Home                                       Social Determinants of Health (SDOH) Interventions    Readmission Risk Interventions No flowsheet data found.

## 2018-09-30 NOTE — ED Provider Notes (Signed)
-----------------------------------------   6:22 AM on 09/30/2018 -----------------------------------------   Blood pressure (!) 156/79, pulse 72, temperature 98 F (36.7 C), temperature source Oral, resp. rate 16, height 5\' 1"  (1.549 m), weight 45 kg, SpO2 98 %.  The patient is sleeping at this time.  There have been no acute events since the last update.  Awaiting disposition plan from social work team.   Paulette Blanch, MD 09/30/18 [phone]

## 2018-09-30 NOTE — TOC Progression Note (Addendum)
Transition of Care [facility]) - Progression Note    Patient Details  Name: Alonda Weaber MRN: [id number] Date of Birth: Oct 12, 1923  Transition of Care [facility]) CM/SW Contact  Marshell Garfinkel, RN Phone Number: 09/30/2018, 9:25 AM  Clinical Narrative:    ED RNCM spoke with Blane Ohara [phone] for update. He plans to speak with daughter in law Nevin Bloodgood today to make arrangements for placement of patient.  Bed search started- then [place] authorization. Per Nicki Reaper, patient is pending Medicaid authorization as she has already applied. Update: ED RNCM received callback from [facility] stating he was able to speak with Nevin Bloodgood and she has agreed to assist patient with placement.    Expected Discharge Plan: (unknown at this time) Barriers to Discharge: Family Issues(DAughter in law will not take patient back home)  Expected Discharge Plan and Services Expected Discharge Plan: (unknown at this time) In-house Referral: Clinical Social Work Discharge Planning Services: CM Consult Post Acute Care Choice: Hospice Living arrangements for the past 2 months: Single Family Home                                       Social Determinants of Health (SDOH) Interventions    Readmission Risk Interventions No flowsheet data found.

## 2018-09-30 NOTE — ED Notes (Signed)
Hourly rounding reveals patient sleeping in room. No complaints, stable, in no acute distress. Q15 minute rounds and monitoring via Engineer, drilling to continue.Patient diaper checked and repositioned.

## 2018-09-30 NOTE — NC FL2 (Signed)
[place] MEDICAID FL2 LEVEL OF CARE SCREENING TOOL     IDENTIFICATION  Patient Name: Connie Beck Birthdate: [birth year] Sex: female Admission Date (Current Location): 09/25/2018  [place]   Facility and Address:  [facility], [address], [place], [place] [address]      Provider Number: [id number]  Attending Physician Name and Address:  No att. providers found  Relative Name and Phone Number:  Gunnar Fusiaula, daughter, [phone]    Current Level of Care: Hospital Recommended Level of Care: Skilled Nursing Facility Prior Approval Number:    Date Approved/Denied:   PASRR Number: [id number]  Discharge Plan: SNF    Current Diagnoses: Patient Active Problem List   Diagnosis Date Noted  . Malnutrition of moderate degree 05/13/2018  . Altered mental status 05/09/2018  . Syncope 03/12/2018  . ARF (acute renal failure) (HCC) 02/11/2018    Orientation RESPIRATION BLADDER Height & Weight     Self  Normal Incontinent Weight: 99 lb 3.3 oz (45 kg) Height:  5\' 1"  (154.9 cm)  BEHAVIORAL SYMPTOMS/MOOD NEUROLOGICAL BOWEL NUTRITION STATUS      Incontinent Diet  AMBULATORY STATUS COMMUNICATION OF NEEDS Skin   Extensive Assist Verbally Normal                       Personal Care Assistance Level of Assistance  Bathing, Feeding, Dressing Bathing Assistance: Maximum assistance Feeding assistance: Maximum assistance Dressing Assistance: Maximum assistance     Functional Limitations Info  Sight, Hearing, Speech Sight Info: Adequate Hearing Info: Adequate Speech Info: Adequate    SPECIAL CARE FACTORS FREQUENCY  PT (By licensed PT), OT (By licensed OT), Speech therapy     PT Frequency: 5 times per week OT Frequency: 5 times per week     Speech Therapy Frequency: 5 times per week      Contractures Contractures Info: Not present    Additional Factors Info  Code Status, Allergies Code Status Info: DNR Allergies  Info: asa, darvon, pencillin, sulfa antibiotics           Current Medications (09/30/2018):  This is the current hospital active medication list Current Facility-Administered Medications  Medication Dose Route Frequency Provider Last Rate Last Dose  . escitalopram (LEXAPRO) tablet 5 mg  5 mg Oral Daily Shaune PollackIsaacs, Cameron, MD   5 mg at 09/30/18 0955  . hyoscyamine (ANASPAZ) disintergrating tablet 0.125 mg  0.125 mg Oral Q4H PRN Shaune PollackIsaacs, Cameron, MD   0.125 mg at 09/29/18 1442  . potassium chloride SA (K-DUR) CR tablet 20 mEq  20 mEq Oral BID Shaune PollackIsaacs, Cameron, MD   20 mEq at 09/30/18 0955  . Resource ThickenUp Clear   Oral PRN Willy Eddyobinson, Patrick, MD       Current Outpatient Medications  Medication Sig Dispense Refill  . escitalopram (LEXAPRO) 5 MG tablet Take 5 mg by mouth daily.    . hyoscyamine (LEVSIN) 0.125 MG tablet Take 0.125 mg by mouth every 4 (four) hours as needed.    Marland Kitchen. LORazepam (ATIVAN) 0.5 MG tablet Take 0.5 mg by mouth every 4 (four) hours as needed for anxiety.    . meclizine (ANTIVERT) 25 MG tablet Take 25 mg by mouth daily.    Marland Kitchen. morphine 20 MG/5ML solution Take 10 mg by mouth every 2 (two) hours as needed for pain.    . nitrofurantoin, macrocrystal-monohydrate, (MACROBID) 100 MG capsule Take 100 mg by mouth daily.       Discharge Medications: Please see discharge  summary for a list of discharge medications.  Relevant Imaging Results:  Relevant Lab Results:   Additional Information SSN:   [id number]  Tania Kerry-Anne Mezo, LCSW

## 2018-09-30 NOTE — ED Notes (Signed)
Hourly rounding reveals patient in room. No complaints, stable, in no acute distress. Q15 minute rounds and monitoring via Engineer, drilling to continue.Patient pulled up in bed and repositioned after checking diaper.

## 2018-09-30 NOTE — NC FL2 (Deleted)
[place] MEDICAID FL2 LEVEL OF CARE SCREENING TOOL     IDENTIFICATION  Patient Name: Connie Beck Birthdate: 12/25/1923 Sex: female Admission Date (Current Location): 09/25/2018  County and Medicaid Number:  [place]   Facility and Address:  [facility], [address], [place], [place] [address]      Provider Number: [id number]  Attending Physician Name and Address:  No att. providers found  Relative Name and Phone Number:  Paula, daughter, [phone]    Current Level of Care: Hospital Recommended Level of Care: Skilled Nursing Facility Prior Approval Number:    Date Approved/Denied:   PASRR Number: [id number]  Discharge Plan: SNF    Current Diagnoses: Patient Active Problem List   Diagnosis Date Noted  . Malnutrition of moderate degree 05/13/2018  . Altered mental status 05/09/2018  . Syncope 03/12/2018  . ARF (acute renal failure) (HCC) 02/11/2018    Orientation RESPIRATION BLADDER Height & Weight     Self  Normal Incontinent Weight: 99 lb 3.3 oz (45 kg) Height:  5' 1" (154.9 cm)  BEHAVIORAL SYMPTOMS/MOOD NEUROLOGICAL BOWEL NUTRITION STATUS      Incontinent Diet  AMBULATORY STATUS COMMUNICATION OF NEEDS Skin   Extensive Assist Verbally Normal                       Personal Care Assistance Level of Assistance  Bathing, Feeding, Dressing Bathing Assistance: Maximum assistance Feeding assistance: Maximum assistance Dressing Assistance: Maximum assistance     Functional Limitations Info  Sight, Hearing, Speech Sight Info: Adequate Hearing Info: Adequate Speech Info: Adequate    SPECIAL CARE FACTORS FREQUENCY  PT (By licensed PT), OT (By licensed OT), Speech therapy     PT Frequency: 5 times per week OT Frequency: 5 times per week     Speech Therapy Frequency: 5 times per week      Contractures Contractures Info: Not present    Additional Factors Info  Code Status, Allergies Code Status Info: DNR Allergies  Info: asa, darvon, pencillin, sulfa antibiotics           Current Medications (09/30/2018):  This is the current hospital active medication list Current Facility-Administered Medications  Medication Dose Route Frequency Provider Last Rate Last Dose  . escitalopram (LEXAPRO) tablet 5 mg  5 mg Oral Daily Isaacs, Cameron, MD   5 mg at 09/30/18 0955  . hyoscyamine (ANASPAZ) disintergrating tablet 0.125 mg  0.125 mg Oral Q4H PRN Isaacs, Cameron, MD   0.125 mg at 09/29/18 1442  . potassium chloride SA (K-DUR) CR tablet 20 mEq  20 mEq Oral BID Isaacs, Cameron, MD   20 mEq at 09/30/18 0955  . Resource ThickenUp Clear   Oral PRN Robinson, Patrick, MD       Current Outpatient Medications  Medication Sig Dispense Refill  . escitalopram (LEXAPRO) 5 MG tablet Take 5 mg by mouth daily.    . hyoscyamine (LEVSIN) 0.125 MG tablet Take 0.125 mg by mouth every 4 (four) hours as needed.    . LORazepam (ATIVAN) 0.5 MG tablet Take 0.5 mg by mouth every 4 (four) hours as needed for anxiety.    . meclizine (ANTIVERT) 25 MG tablet Take 25 mg by mouth daily.    . morphine 20 MG/5ML solution Take 10 mg by mouth every 2 (two) hours as needed for pain.    . nitrofurantoin, macrocrystal-monohydrate, (MACROBID) 100 MG capsule Take 100 mg by mouth daily.       Discharge Medications: Please see discharge   summary for a list of discharge medications.  Relevant Imaging Results:  Relevant Lab Results:   Additional Information SSN:   [id number]  Tania Merdis Snodgrass, LCSW

## 2018-09-30 NOTE — ED Notes (Signed)
Pt ate 50% of her breakfast and drank her juice

## 2018-10-01 LAB — SARS CORONAVIRUS 2 BY RT PCR (HOSPITAL ORDER, PERFORMED IN ~~LOC~~ HOSPITAL LAB): SARS Coronavirus 2: NEGATIVE

## 2018-10-01 NOTE — ED Notes (Signed)
Pt repositioned in bed.

## 2018-10-01 NOTE — ED Notes (Signed)
Pt given few more sips of thickened liquids. Oral care performed by this RN. Pt states she is comfortable. Will continue to monitor for further patient needs.

## 2018-10-01 NOTE — ED Notes (Signed)
Pt repositioned on left side, brief is dry. Pt is comfortable and has no further needs at this time.

## 2018-10-01 NOTE — TOC Progression Note (Signed)
Transition of Care [facility]) - Progression Note    Patient Details  Name: Connie Beck MRN: [id number] Date of Birth: [birth year]  Transition of Care [facility]) CM/SW Contact  Connie Garfinkel, RN Phone Number: 10/01/2018, 9:40 AM  Clinical Narrative:    ED RNCM received call from [facility] with [phone]. Patient has Medicaid and Nicki Reaper has shared this with the [facility].    Expected Discharge Plan: (unknown at this time) Barriers to Discharge: Family Issues(DAughter in law will not take patient back home)  Expected Discharge Plan and Services Expected Discharge Plan: (unknown at this time) In-house Referral: Clinical Social Work Discharge Planning Services: CM Consult Post Acute Care Choice: Hospice Living arrangements for the past 2 months: Single Family Home                                       Social Determinants of Health (SDOH) Interventions    Readmission Risk Interventions No flowsheet data found.

## 2018-10-01 NOTE — ED Provider Notes (Signed)
-----------------------------------------   4:17 AM on 10/01/2018 -----------------------------------------   Blood pressure 124/62, pulse 77, temperature 97.9 F (36.6 C), temperature source Oral, resp. rate 17, height 1.549 m (5\' 1" ), weight 45 kg, SpO2 98 %.  The patient is sleeping at this time.  There have been no acute events since the last update.  Awaiting disposition plan from Behavioral Medicine team.  Novel coronavirus testing is negative.   Hinda Kehr, MD 10/01/18 [phone]

## 2018-10-01 NOTE — ED Notes (Signed)
Report called to Mateo Flow, Therapist, sports at [facility].

## 2018-10-01 NOTE — TOC Progression Note (Addendum)
Transition of Care [facility]) - Progression Note    Patient Details  Name: Connie Beck MRN: [id number] Date of Birth: [birth year]  [facility] CM/SW Contact  Marshell Garfinkel, RN Phone Number: 10/01/2018, 11:26 AM  Clinical Narrative:     Call received from [facility] with [facility] [phone] stating that patient can come to their facility today pending repeat Covid test. EDP will reorder. Scott [phone] with APS updated.  Update at 1322: Covid test again is negative. Message left for [facility] with [facility] requesting room and report number. Update at 1446: bed assignment 202A; report number [phone]. Scott with APS updated [phone]. Ex daughter in law [place] updated. ED RNCM confirmed with Nevin Bloodgood that patient is DNR status as she was under [facility] care at home. Report number and bed assignment delivered to [facility].   Expected Discharge Plan: (unknown at this time) Barriers to Discharge: Family Issues(DAughter in law will not take patient back home)  Expected Discharge Plan and Services Expected Discharge Plan: (unknown at this time) In-house Referral: Clinical Social Work Discharge Planning Services: CM Consult Post Acute Care Choice: Hospice Living arrangements for the past 2 months: Single Family Home                                       Social Determinants of Health (SDOH) Interventions    Readmission Risk Interventions No flowsheet data found.

## 2018-10-01 NOTE — ED Notes (Signed)
Pt asleep, breakfast tray placed in rm.  

## 2018-10-01 NOTE — ED Notes (Signed)
Pt incontinent of urine. Pt repositioned in bed.

## 2018-10-01 NOTE — ED Notes (Signed)
This RN assisted Tracey, EDT with repositioning and comfort measures. This RN fed patient, pt drank approx 1/2 thickened milk, and ate several bites of apple sauce and several bites of cream of wheat.

## 2018-12-09 DEATH — deceased

## 2020-11-10 IMAGING — CR DG CHEST 2V
2 series · 2 of 2 positions shown · non-contrast
Comparison: 05/09/2018

CLINICAL DATA: Encounter for evaluation of fever. Patient poor
historian at this time. Hx HTN. Non-smoker.

EXAM:
CHEST - 2 VIEW

[chest lat]
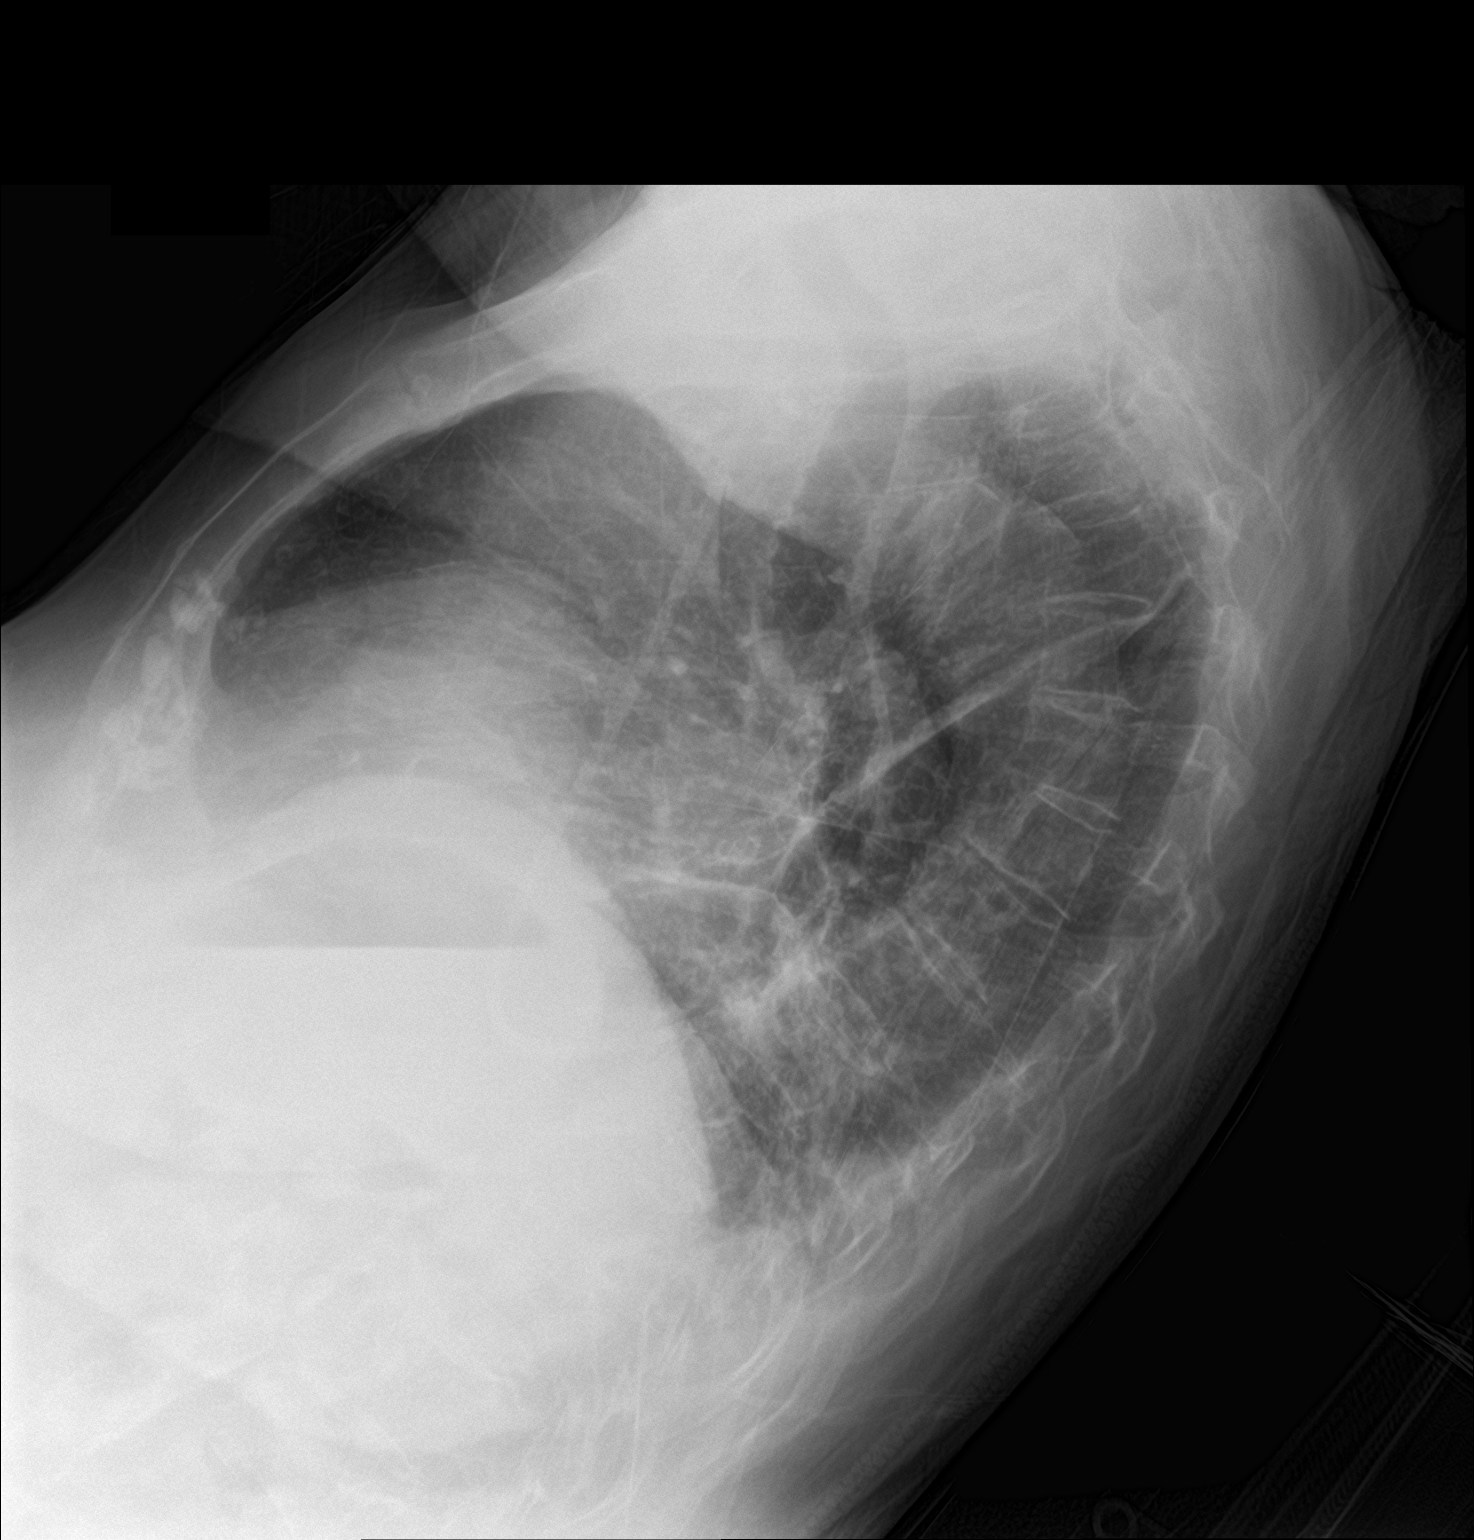

[chest ap]
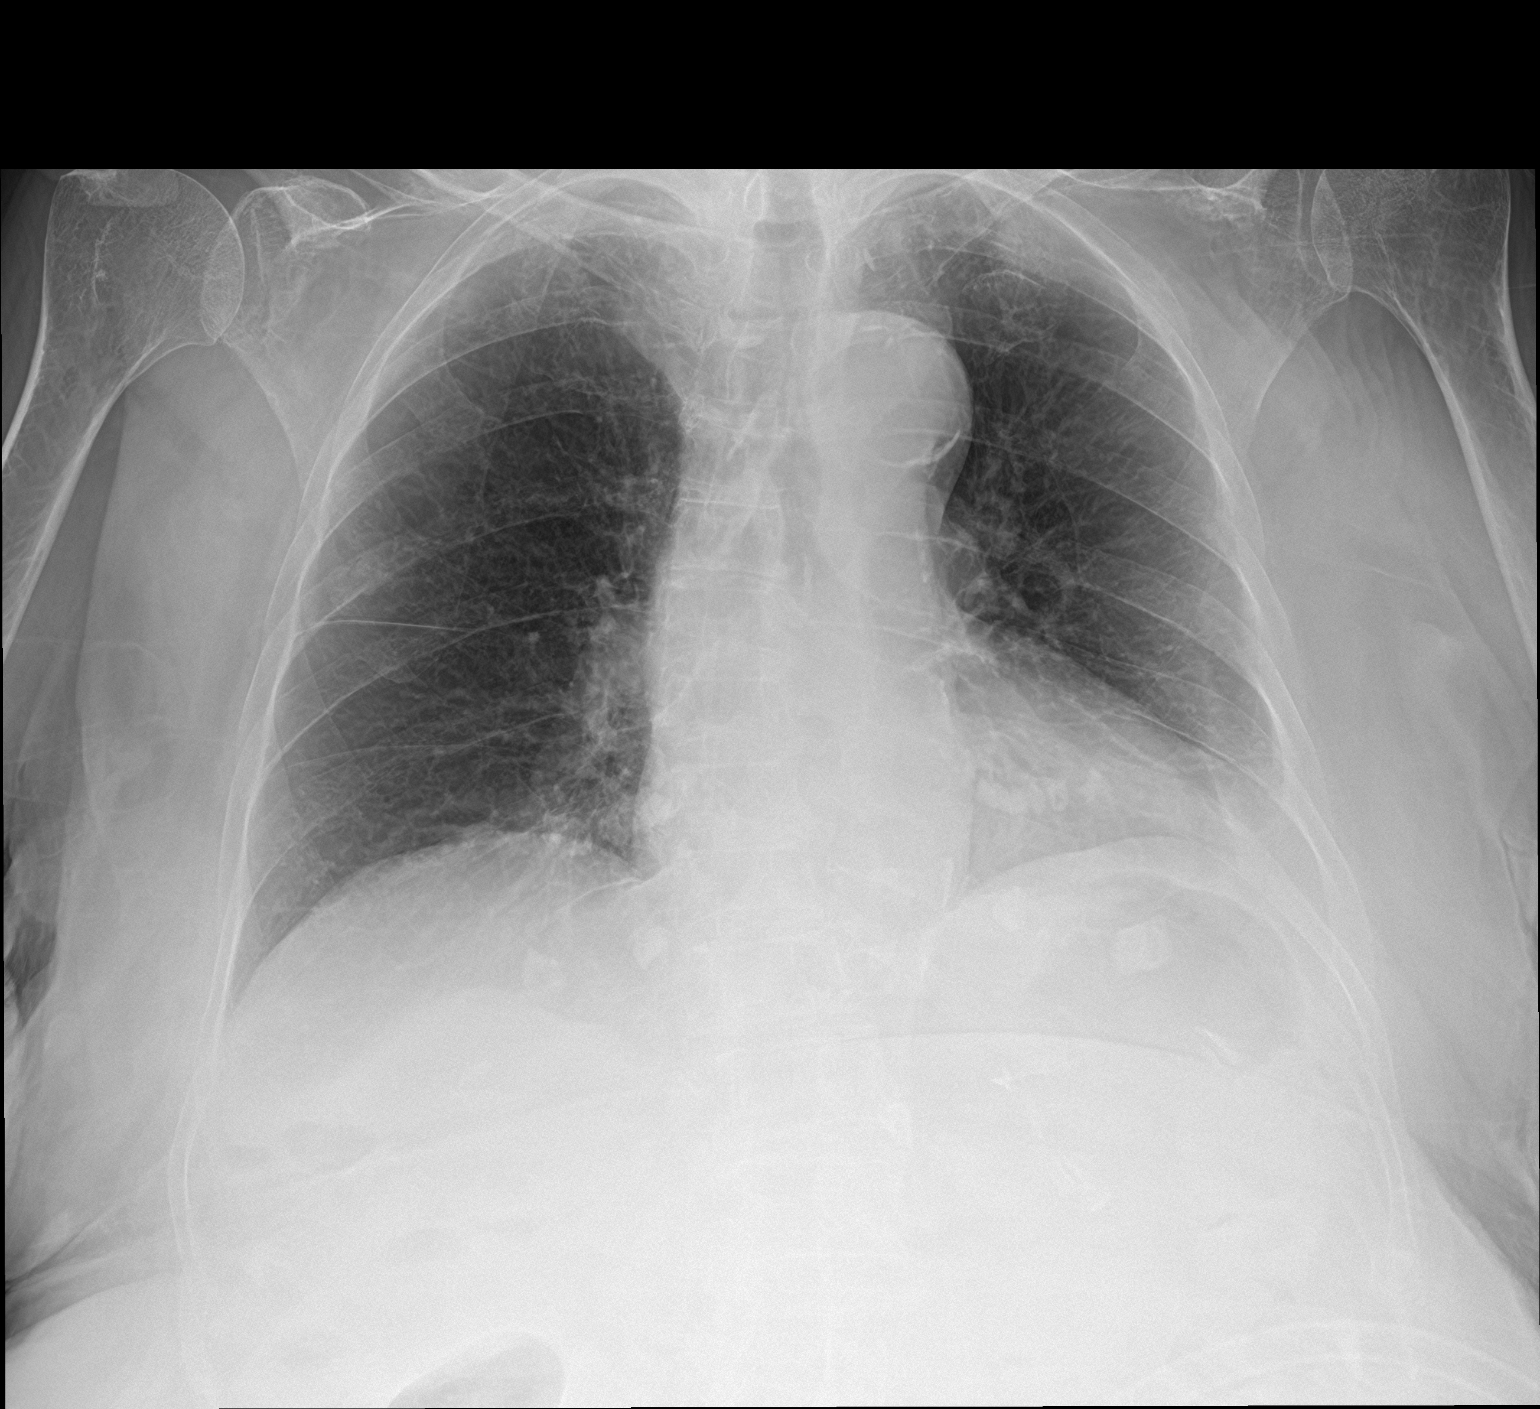

[2 of 2 positions shown; findings below may reference images not displayed]

FINDINGS: Cardiac silhouette is normal in size. No mediastinal or hilar
masses.

Chronic bronchitic change at the lung bases. No evidence of
pneumonia or pulmonary edema. No pleural effusion or pneumothorax.

Skeletal structures are demineralized but grossly intact.
IMPRESSION: No acute cardiopulmonary disease.

## 2020-11-10 IMAGING — CR DG LUMBAR SPINE 2-3V
3 series · 3 of 3 positions shown · non-contrast
Comparison: Abdomen radiographs, 03/12/2018.

CLINICAL DATA: Encounter for evaluation of low back pain for
unspecified amount of time. Patient poor historian at this time.
Unable to obtain further hx at this time.

EXAM:
LUMBAR SPINE - 2-3 VIEW

[l-spine ap]
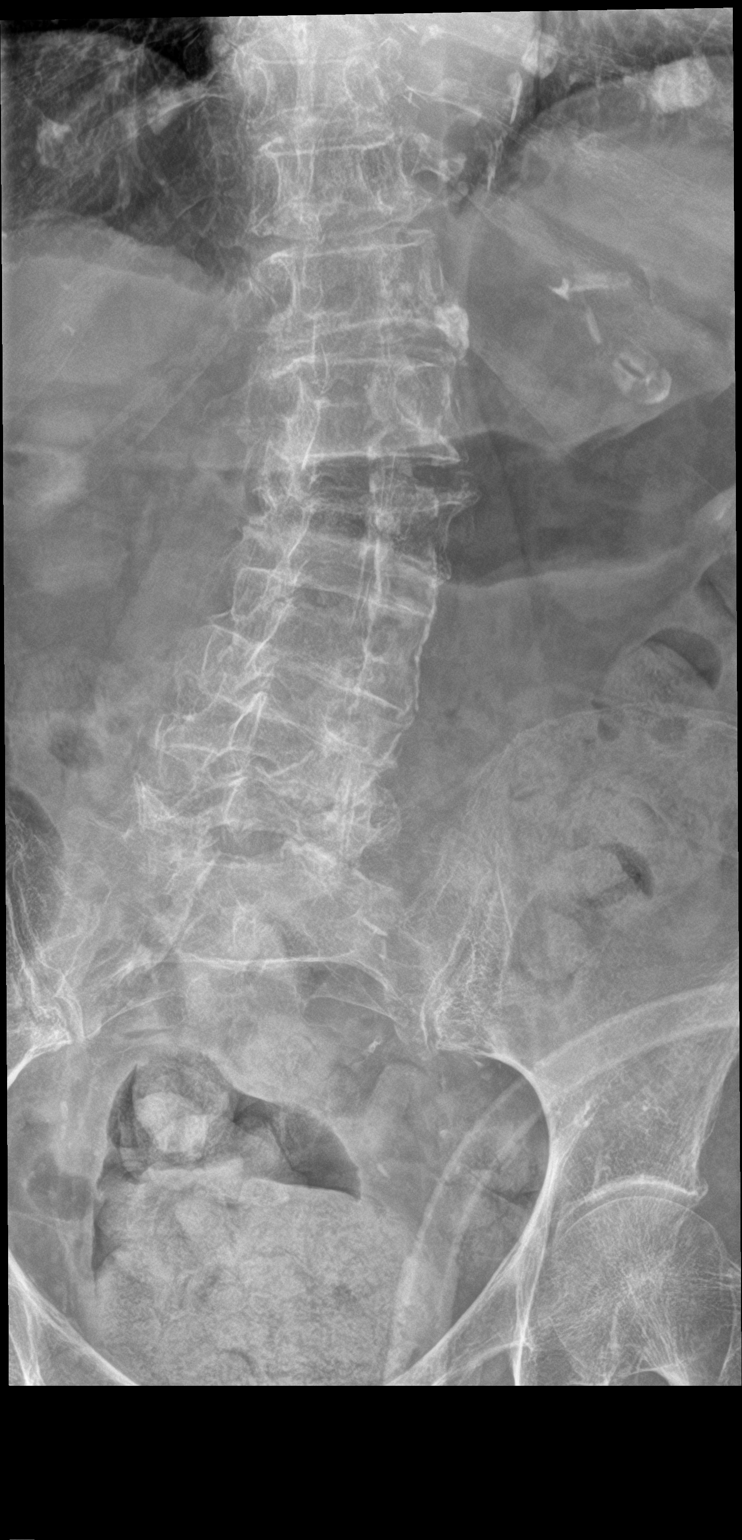

[l-spine lat]
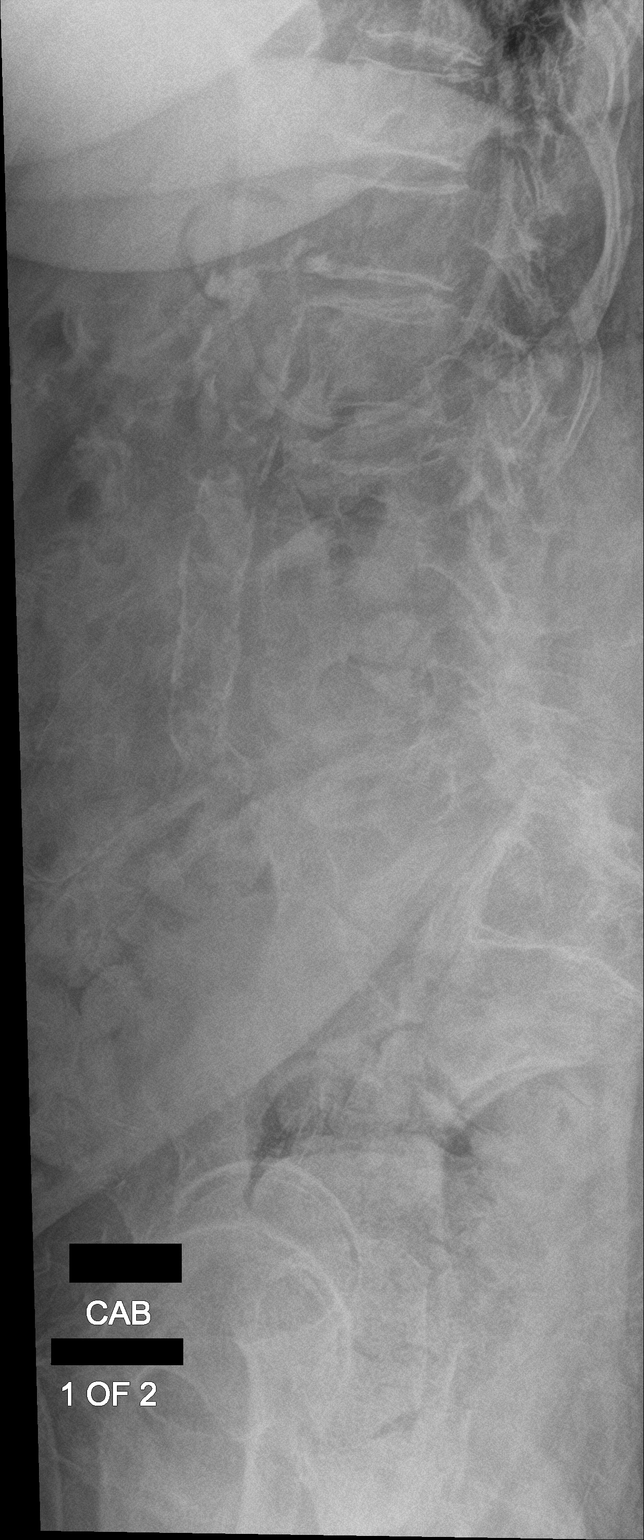

[l-spine spot]
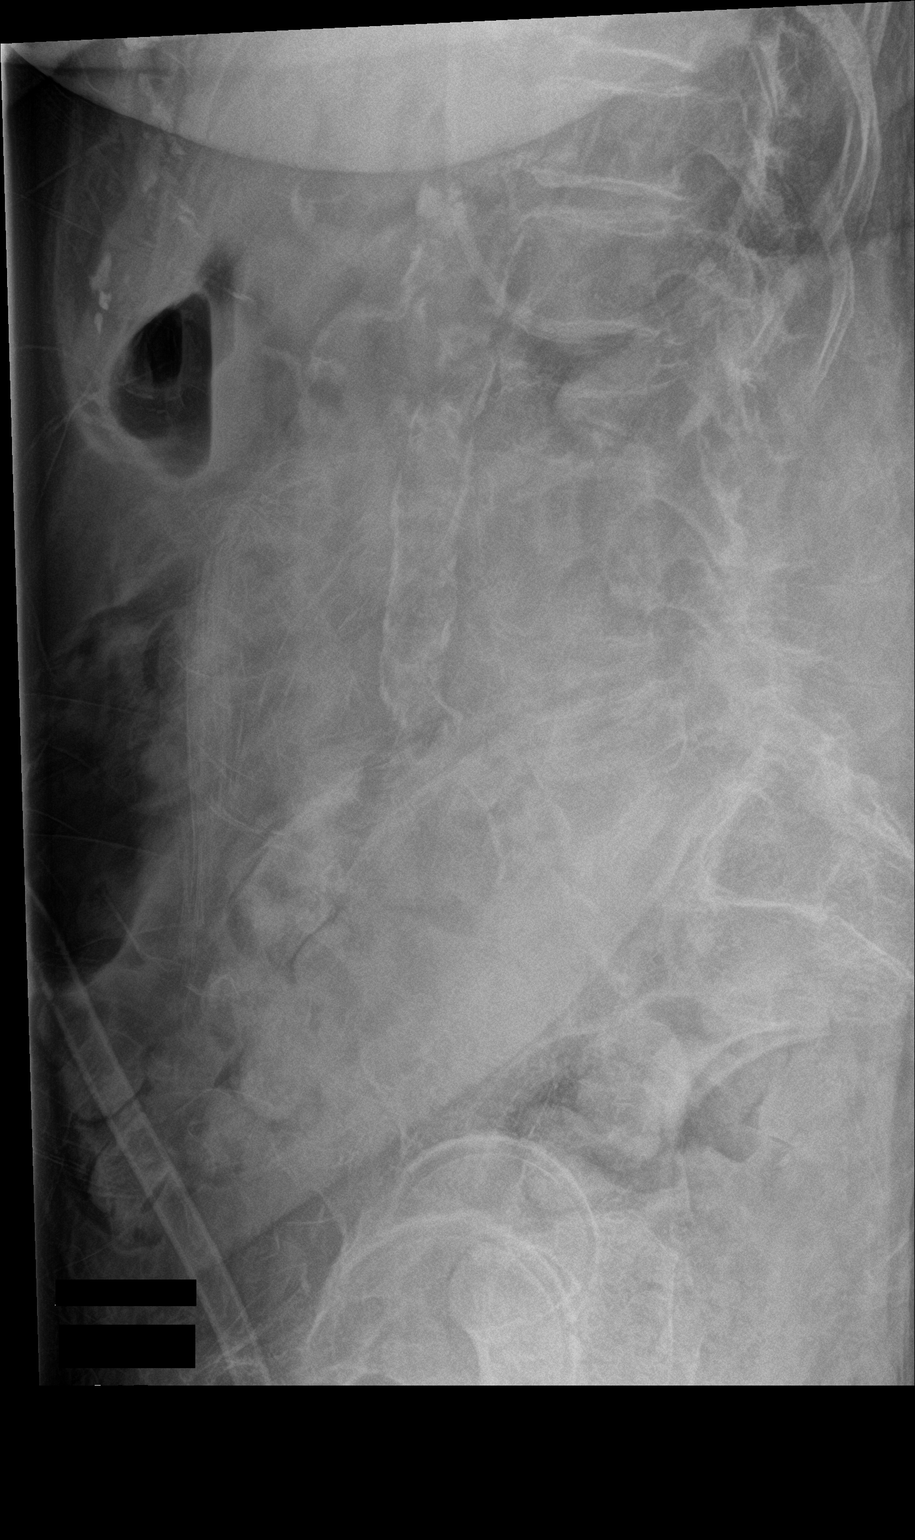

[3 of 3 positions shown; findings below may reference images not displayed]

FINDINGS: Moderate compression fracture of L2. This appears unchanged from the
prior abdomen radiographs. No other convincing fractures. Grade 1
anterolisthesis of L5 on S1. Levoscoliosis, apex at L1-L2, stable.

Bones are extensively demineralized. There are degenerative changes
along the mid to lower lumbar spine that are not well-defined due to
the degree of bone demineralization.

There are dense atherosclerotic calcifications along a normal
caliber abdominal aorta. Increased stool is noted in the rectum.
IMPRESSION: 1. No acute findings.
2. Chronic compression fracture of L2. Levoscoliosis. Chronic
degenerative changes that are not well-defined due to extensive bony
demineralization.
